# Patient Record
Sex: Male | Born: 1945 | Race: White | Hispanic: No | Marital: Married | Smoking: Never smoker
Health system: Southern US, Community
[De-identification: ages and names within clinical notes are randomized; demographics above are authoritative.]

## PROBLEM LIST (undated history)

## (undated) DIAGNOSIS — F1011 Alcohol abuse, in remission: Secondary | ICD-10-CM

## (undated) DIAGNOSIS — E785 Hyperlipidemia, unspecified: Secondary | ICD-10-CM

## (undated) DIAGNOSIS — M4802 Spinal stenosis, cervical region: Secondary | ICD-10-CM

## (undated) DIAGNOSIS — I10 Essential (primary) hypertension: Secondary | ICD-10-CM

## (undated) HISTORY — DX: Alcohol abuse, in remission: F10.11

## (undated) HISTORY — DX: Essential (primary) hypertension: I10

## (undated) HISTORY — PX: SMALL INTESTINE SURGERY: SHX150

## (undated) HISTORY — DX: Spinal stenosis, cervical region: M48.02

## (undated) HISTORY — DX: Hyperlipidemia, unspecified: E78.5

---

## 1994-05-25 HISTORY — PX: HERNIA REPAIR: SHX51

## 2004-05-25 HISTORY — PX: CARDIOVASCULAR STRESS TEST: SHX262

## 2005-06-16 ENCOUNTER — Ambulatory Visit: Payer: Self-pay | Admitting: Family Medicine

## 2009-01-04 LAB — HM COLONOSCOPY: HM Colonoscopy: NORMAL

## 2012-01-05 ENCOUNTER — Encounter: Payer: Self-pay | Admitting: Internal Medicine

## 2012-01-05 ENCOUNTER — Ambulatory Visit (INDEPENDENT_AMBULATORY_CARE_PROVIDER_SITE_OTHER): Payer: Medicare Other | Admitting: Internal Medicine

## 2012-01-05 VITALS — BP 142/80 | HR 58 | Temp 98.4°F | Resp 16 | Ht 73.0 in | Wt 191.2 lb

## 2012-01-05 DIAGNOSIS — I10 Essential (primary) hypertension: Secondary | ICD-10-CM

## 2012-01-05 DIAGNOSIS — I499 Cardiac arrhythmia, unspecified: Secondary | ICD-10-CM

## 2012-01-05 DIAGNOSIS — F1011 Alcohol abuse, in remission: Secondary | ICD-10-CM

## 2012-01-05 DIAGNOSIS — E785 Hyperlipidemia, unspecified: Secondary | ICD-10-CM

## 2012-01-05 DIAGNOSIS — G8929 Other chronic pain: Secondary | ICD-10-CM

## 2012-01-05 DIAGNOSIS — M549 Dorsalgia, unspecified: Secondary | ICD-10-CM

## 2012-01-05 DIAGNOSIS — M4802 Spinal stenosis, cervical region: Secondary | ICD-10-CM

## 2012-01-05 MED ORDER — TAMSULOSIN HCL 0.4 MG PO CAPS: 0.4000 mg | ORAL_CAPSULE | Freq: Every day | ORAL | Status: DC

## 2012-01-05 MED ORDER — ATORVASTATIN CALCIUM 10 MG PO TABS: 10.0000 mg | ORAL_TABLET | Freq: Every day | ORAL | Status: AC
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
## 2012-01-05 NOTE — Assessment & Plan Note (Addendum)
Managed with multiple drug therapy. Home bps 120/70 .  No changes today. Return for assessment of renal function and fasting lipids.

## 2012-01-06 ENCOUNTER — Encounter: Payer: Self-pay | Admitting: Internal Medicine

## 2012-01-06 DIAGNOSIS — E785 Hyperlipidemia, unspecified: Secondary | ICD-10-CM | POA: Insufficient documentation

## 2012-01-06 NOTE — Assessment & Plan Note (Signed)
Managed with statin therapy with no side effects. Return for fasting labs prior to next visit.

## 2012-01-06 NOTE — Assessment & Plan Note (Signed)
Managed with a combination of NSAIDs tramadol and Vicodin. He is planning to increase his tramadol use in an effort to decrease his Vicodin use to once a day.

## 2012-01-06 NOTE — Assessment & Plan Note (Signed)
He is a recovering alcoholic and has been abstinent for years.

## 2012-01-06 NOTE — Assessment & Plan Note (Signed)
He has occasional extra beats discovered by Dr. Suzie Portela his prior PCP this was investigated with a Holter monitor by Dr. Gwen Pounds which was apparently normal. Exam today was normal.

## 2012-01-14 ENCOUNTER — Other Ambulatory Visit (INDEPENDENT_AMBULATORY_CARE_PROVIDER_SITE_OTHER): Payer: Medicare Other | Admitting: *Deleted

## 2012-01-14 DIAGNOSIS — E785 Hyperlipidemia, unspecified: Secondary | ICD-10-CM

## 2012-01-14 DIAGNOSIS — E871 Hypo-osmolality and hyponatremia: Secondary | ICD-10-CM

## 2012-01-14 DIAGNOSIS — I10 Essential (primary) hypertension: Secondary | ICD-10-CM

## 2012-01-14 LAB — COMPREHENSIVE METABOLIC PANEL
Albumin: 4.3 g/dL (ref 3.5–5.2)
Alkaline Phosphatase: 50 U/L (ref 39–117)
Calcium: 9.1 mg/dL (ref 8.4–10.5)
Chloride: 88 mEq/L — ABNORMAL LOW (ref 96–112)
Glucose, Bld: 124 mg/dL — ABNORMAL HIGH (ref 70–99)
Potassium: 4.4 mEq/L (ref 3.5–5.1)
Sodium: 125 mEq/L — ABNORMAL LOW (ref 135–145)
Total Protein: 7.4 g/dL (ref 6.0–8.3)

## 2012-01-14 LAB — LIPID PANEL
Cholesterol: 180 mg/dL (ref 0–200)
LDL Cholesterol: 95 mg/dL (ref 0–99)
Total CHOL/HDL Ratio: 4

## 2012-01-15 NOTE — Addendum Note (Signed)
Addended by: Duncan Dull on: 01/15/2012 12:40 AM   Modules accepted: Orders

## 2012-03-04 ENCOUNTER — Encounter: Payer: Self-pay | Admitting: Internal Medicine

## 2012-03-18 ENCOUNTER — Telehealth: Payer: Self-pay | Admitting: Internal Medicine

## 2012-03-18 MED ORDER — OMEPRAZOLE 40 MG PO CPDR: 40.0000 mg | DELAYED_RELEASE_CAPSULE | Freq: Every day | ORAL | Status: AC

## 2012-03-18 NOTE — Telephone Encounter (Signed)
Refill sent #90 with 3

## 2012-03-18 NOTE — Telephone Encounter (Signed)
Pt is needing refill on Prilosec. [facility] was calling and said they faxed it over but I went through all the faxes while on the phone with the pharmacist and didn't see it. I told her to fax it over again. But she said just in case they don't come through if we could send it over to them. Pt is needing the refill today.

## 2012-03-18 NOTE — Telephone Encounter (Signed)
Pt is needing refill on Omepreazole 40mg  he uses [facility].

## 2012-03-21 NOTE — Telephone Encounter (Signed)
rx called into [facility]

## 2012-04-12 ENCOUNTER — Ambulatory Visit: Payer: Self-pay | Admitting: Orthopedic Surgery

## 2012-04-13 ENCOUNTER — Other Ambulatory Visit: Payer: Self-pay | Admitting: Internal Medicine

## 2012-04-14 NOTE — Telephone Encounter (Signed)
Dr Darrick Huntsman,  Last OV 12/2011 Flomax 0.4 Refill?

## 2012-04-15 MED ORDER — TAMSULOSIN HCL 0.4 MG PO CAPS: 0.4000 mg | ORAL_CAPSULE | Freq: Every day | ORAL | Status: AC

## 2012-04-15 NOTE — Addendum Note (Signed)
Addended by: Sherlene Shams on: 04/15/2012 07:02 AM   Modules accepted: Orders

## 2012-04-15 NOTE — Telephone Encounter (Signed)
Yes refill authorized

## 2012-05-04 ENCOUNTER — Other Ambulatory Visit: Payer: Self-pay

## 2012-05-04 MED ORDER — HYDROCHLOROTHIAZIDE 12.5 MG PO CAPS: 12.5000 mg | ORAL_CAPSULE | Freq: Every day | ORAL | Status: AC

## 2012-05-04 NOTE — Telephone Encounter (Signed)
Refill request for Hydrochlorothiazide 12.5 mg # 30 3 R sent to [facility]

## 2012-06-02 ENCOUNTER — Other Ambulatory Visit: Payer: Self-pay | Admitting: Internal Medicine

## 2012-06-02 NOTE — Telephone Encounter (Signed)
amLODipine (NORVASC) 5 MG tablet ° °# 90 °

## 2012-06-03 ENCOUNTER — Other Ambulatory Visit: Payer: Self-pay | Admitting: Internal Medicine

## 2012-06-03 MED ORDER — AMLODIPINE BESYLATE 5 MG PO TABS: 5.0000 mg | ORAL_TABLET | Freq: Every day | ORAL | Status: DC

## 2012-06-03 NOTE — Telephone Encounter (Signed)
thos patient was e mailed in august about having a low sodium and never returned for the repeat BMET. He read the message but did not return,. Ok to refill amlodipine but Do not refill his HCTZ  And please call and remind him that a low sodium can cause seizures and needs to be resolved so he needs the labs repeated

## 2012-06-03 NOTE — Telephone Encounter (Signed)
Ok to fill? Pt last seen on 8/13 and this is listed as a historical med.

## 2012-06-03 NOTE — Telephone Encounter (Signed)
An encounter is already started on this refill.

## 2012-06-03 NOTE — Telephone Encounter (Signed)
[facility] [address] fax # [phone]  amLODipine (NORVASC) 5 MG tablet  #90

## 2012-06-06 ENCOUNTER — Telehealth: Payer: Self-pay | Admitting: *Deleted

## 2012-06-06 ENCOUNTER — Other Ambulatory Visit (INDEPENDENT_AMBULATORY_CARE_PROVIDER_SITE_OTHER): Payer: Medicare Other

## 2012-06-06 DIAGNOSIS — Z13220 Encounter for screening for lipoid disorders: Secondary | ICD-10-CM

## 2012-06-06 DIAGNOSIS — Z136 Encounter for screening for cardiovascular disorders: Secondary | ICD-10-CM

## 2012-06-06 DIAGNOSIS — Z139 Encounter for screening, unspecified: Secondary | ICD-10-CM

## 2012-06-06 LAB — COMPREHENSIVE METABOLIC PANEL
ALT: 29 U/L (ref 0–53)
Albumin: 4.3 g/dL (ref 3.5–5.2)
CO2: 28 mEq/L (ref 19–32)
Calcium: 9.4 mg/dL (ref 8.4–10.5)
Chloride: 95 mEq/L — ABNORMAL LOW (ref 96–112)
Creatinine, Ser: 0.7 mg/dL (ref 0.4–1.5)
GFR: 112.36 mL/min (ref >60.00)
Total Protein: 7.4 g/dL (ref 6.0–8.3)

## 2012-06-06 LAB — LIPID PANEL: HDL: 42.3 mg/dL (ref >39.00)

## 2012-06-06 NOTE — Telephone Encounter (Signed)
Pt notified has an appt today to get labs redrawn.

## 2012-06-06 NOTE — Telephone Encounter (Signed)
Pt states that his cardiologist Dr.Kowalski told him to continue his medication (hydrochlorthiazide). Would like to know what he should do?

## 2012-06-06 NOTE — Telephone Encounter (Signed)
Two issues.  He needs a CMET and fasting lipids.  2) he never returned for a repeat BMEt last August after I stopped his HCTZ which was dropping his sodium level. So i do not advise resuming the HCTZ

## 2012-06-06 NOTE — Telephone Encounter (Signed)
What labs and dx would you like for this pt? Is it just the BMET? Thank you

## 2012-06-07 ENCOUNTER — Telehealth: Payer: Self-pay | Admitting: Internal Medicine

## 2012-06-07 NOTE — Telephone Encounter (Signed)
Did you see this? 

## 2012-06-07 NOTE — Telephone Encounter (Signed)
Message copied by Sherlene Shams on Tue Jun 07, 2012  1:29 PM ------      Message from: Jackson Latino      Created: Tue Jun 07, 2012 11:54 AM       Pt advised he had eaten before labs had been completed.

## 2012-07-09 ENCOUNTER — Other Ambulatory Visit: Payer: Self-pay

## 2012-09-12 ENCOUNTER — Encounter: Payer: Self-pay | Admitting: *Deleted

## 2012-09-12 ENCOUNTER — Other Ambulatory Visit: Payer: Self-pay | Admitting: *Deleted

## 2012-09-12 MED ORDER — LOSARTAN POTASSIUM 50 MG PO TABS: 50.0000 mg | ORAL_TABLET | Freq: Every day | ORAL | Status: DC

## 2012-09-30 ENCOUNTER — Other Ambulatory Visit: Payer: Self-pay | Admitting: Internal Medicine

## 2012-09-30 NOTE — Telephone Encounter (Signed)
Refill escripted today.

## 2012-10-27 ENCOUNTER — Ambulatory Visit: Payer: Self-pay | Admitting: Orthopedic Surgery

## 2012-11-05 ENCOUNTER — Other Ambulatory Visit: Payer: Self-pay | Admitting: Internal Medicine

## 2012-11-16 ENCOUNTER — Other Ambulatory Visit: Payer: Self-pay | Admitting: *Deleted

## 2012-12-06 ENCOUNTER — Other Ambulatory Visit: Payer: Self-pay | Admitting: Internal Medicine

## 2012-12-28 ENCOUNTER — Other Ambulatory Visit: Payer: Self-pay

## 2013-03-30 ENCOUNTER — Other Ambulatory Visit: Payer: Self-pay

## 2013-04-27 ENCOUNTER — Ambulatory Visit: Payer: Self-pay | Admitting: Orthopedic Surgery

## 2013-05-04 ENCOUNTER — Ambulatory Visit: Payer: Self-pay | Admitting: Orthopedic Surgery

## 2013-06-23 ENCOUNTER — Inpatient Hospital Stay: Payer: Self-pay | Admitting: Orthopedic Surgery

## 2013-06-23 LAB — BASIC METABOLIC PANEL
Anion Gap: 6 — ABNORMAL LOW (ref 7–16)
BUN: 16 mg/dL (ref 7–18)
CO2: 29 mmol/L (ref 21–32)
Calcium, Total: 8.8 mg/dL (ref 8.5–10.1)
Chloride: 93 mmol/L — ABNORMAL LOW (ref 98–107)
Creatinine: 0.9 mg/dL (ref 0.60–1.30)
EGFR (Non-African Amer.): >60
GLUCOSE: 212 mg/dL — AB (ref 65–99)
Osmolality: 265 (ref 275–301)
Potassium: 3.9 mmol/L (ref 3.5–5.1)
Sodium: 128 mmol/L — ABNORMAL LOW (ref 136–145)

## 2013-06-23 LAB — CBC WITH DIFFERENTIAL/PLATELET
BASOS ABS: 0.1 10*3/uL (ref 0.0–0.1)
Basophil %: 0.4 %
EOS ABS: 0 10*3/uL (ref 0.0–0.7)
Eosinophil %: 0.2 %
HCT: 41.1 % (ref 40.0–52.0)
HGB: 13.6 g/dL (ref 13.0–18.0)
LYMPHS ABS: 0.8 10*3/uL — AB (ref 1.0–3.6)
LYMPHS PCT: 6.1 %
MCH: 29.2 pg (ref 26.0–34.0)
MCHC: 33.1 g/dL (ref 32.0–36.0)
MCV: 88 fL (ref 80–100)
Monocyte #: 1.1 x10 3/mm — ABNORMAL HIGH (ref 0.2–1.0)
Monocyte %: 7.8 %
NEUTROS ABS: 11.7 10*3/uL — AB (ref 1.4–6.5)
Neutrophil %: 85.5 %
Platelet: 232 10*3/uL (ref 150–440)
RBC: 4.65 10*6/uL (ref 4.40–5.90)
RDW: 12.5 % (ref 11.5–14.5)
WBC: 13.7 10*3/uL — AB (ref 3.8–10.6)

## 2013-06-23 LAB — SEDIMENTATION RATE: Erythrocyte Sed Rate: 15 mm/hr (ref 0–20)

## 2013-06-25 LAB — CBC WITH DIFFERENTIAL/PLATELET
BASOS ABS: 0.1 10*3/uL (ref 0.0–0.1)
Basophil %: 0.6 %
Eosinophil #: 0.3 10*3/uL (ref 0.0–0.7)
Eosinophil %: 2.6 %
HCT: 34.8 % — AB (ref 40.0–52.0)
HGB: 12.3 g/dL — ABNORMAL LOW (ref 13.0–18.0)
Lymphocyte #: 2.3 10*3/uL (ref 1.0–3.6)
Lymphocyte %: 22.7 %
MCH: 30.8 pg (ref 26.0–34.0)
MCHC: 35.3 g/dL (ref 32.0–36.0)
MCV: 87 fL (ref 80–100)
Monocyte #: 1.3 x10 3/mm — ABNORMAL HIGH (ref 0.2–1.0)
Monocyte %: 12.4 %
NEUTROS PCT: 61.7 %
Neutrophil #: 6.3 10*3/uL (ref 1.4–6.5)
PLATELETS: 246 10*3/uL (ref 150–440)
RBC: 3.98 10*6/uL — AB (ref 4.40–5.90)
RDW: 12.6 % (ref 11.5–14.5)
WBC: 10.2 10*3/uL (ref 3.8–10.6)

## 2013-07-24 ENCOUNTER — Ambulatory Visit: Payer: Self-pay | Admitting: Orthopedic Surgery

## 2014-02-09 ENCOUNTER — Ambulatory Visit: Payer: Self-pay | Admitting: Gastroenterology

## 2014-08-14 IMAGING — CR RIGHT ANKLE - 2 VIEW
1 series · 2 of 2 positions shown · non-contrast
Comparison: 10/27/2012.

CLINICAL DATA: Status post hardware removal

EXAM:
RIGHT ANKLE - 2 VIEW

[Series 1: ap · 0.17mm/px · 2 of 2 slices shown]
[im 1/2]
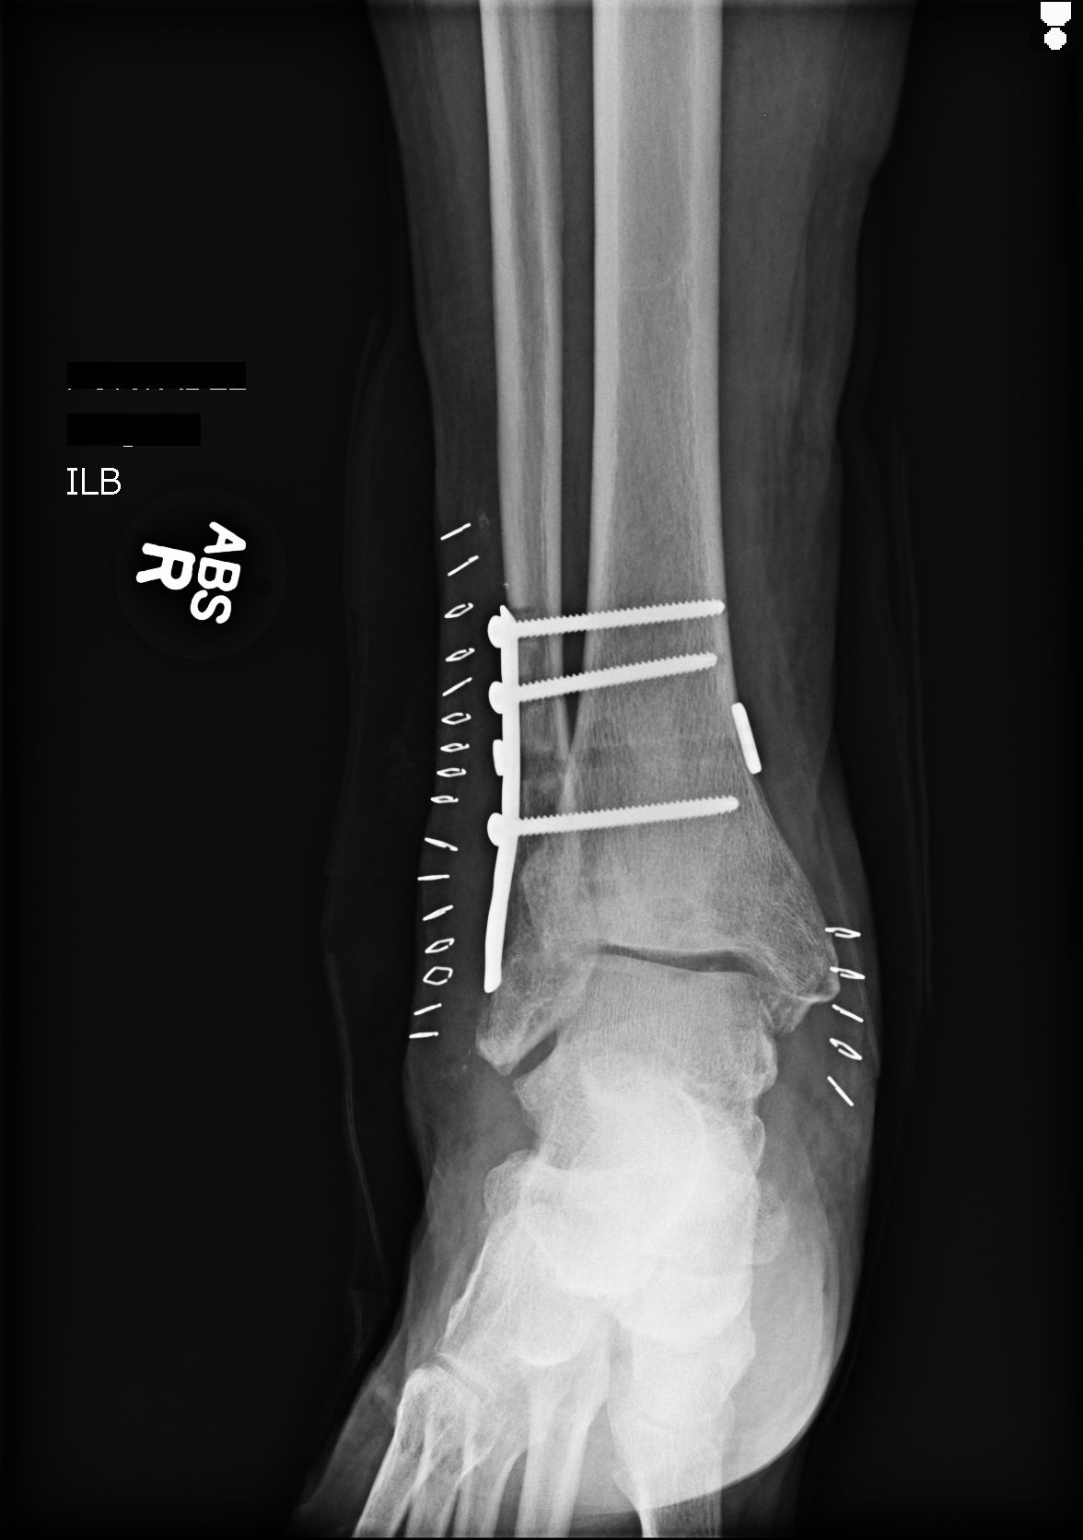
[im 2/2]
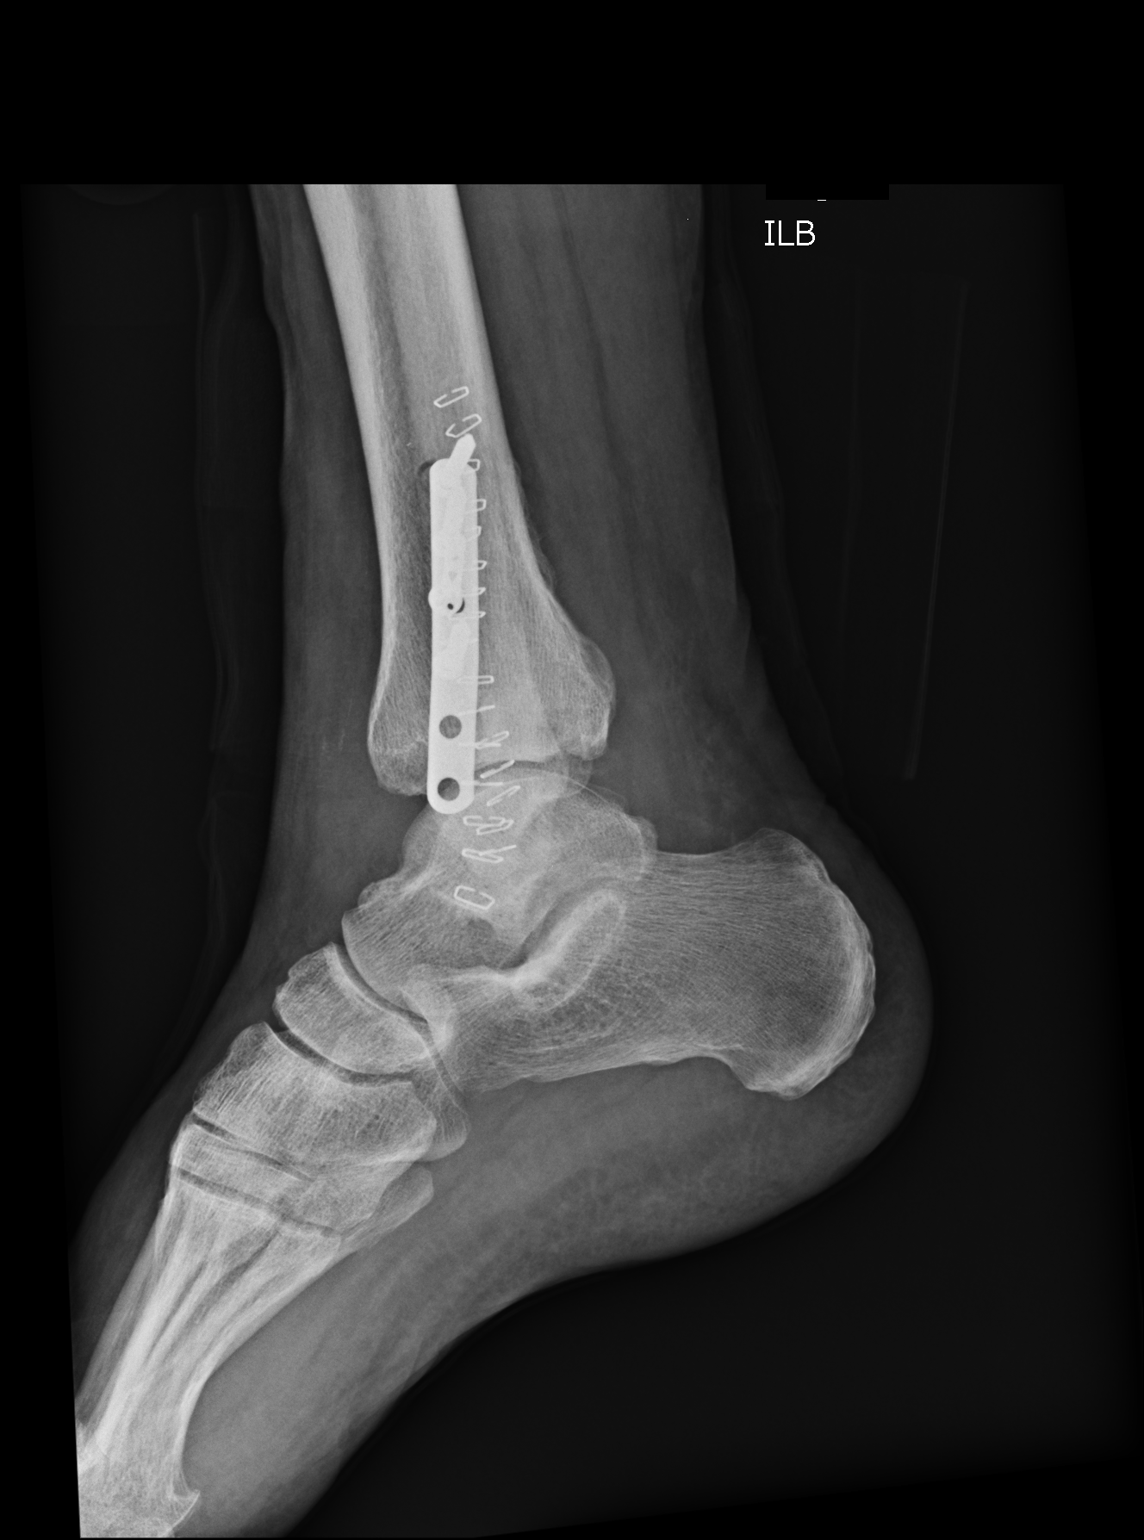

[2 of 2 positions shown; findings below may reference images not displayed]

FINDINGS: There is no acute fracture or dislocation. There is interval removal
of the previous distal fibular sideplate with interval placement of
a different metallic side plate with multiple interlocking screws
and a syndesmotic fixation device. There are postsurgical changes in
the surrounding soft tissues. The ankle mortise is intact.
IMPRESSION: Interval replacement of of the existing orthopedic hardware.

## 2014-09-14 NOTE — Op Note (Signed)
PATIENT NAME:  Taylor Mcbride, TROMPETER MR#:  [id number] DATE OF BIRTH:  [birth year]  DATE OF PROCEDURE:  05/04/2013  PREOPERATIVE DIAGNOSIS: Chronic syndesmosis rupture, right ankle.   POSTOPERATIVE DIAGNOSIS: Chronic syndesmosis rupture, right ankle.   PROCEDURE: Removal of prior hardware, right lateral ankle, and repair of syndesmosis.   ANESTHESIA: General.   SURGEON: Laurene Footman, M.D.   DESCRIPTION OF PROCEDURE: The patient was brought to the operating room and after adequate anesthesia was obtained, the right leg was prepped and draped in the usual sterile fashion with a tourniquet applied to the upper calf. After patient identification and timeout procedures were completed, the prior lateral incision was opened with the tourniquet raised. The prior plate was exposed, and the anterior to posterior screw was removed without difficulty. The lateral screw holes, however, had hex screws which could not be removed with the broken screw set from [employer], and the screws were removed by burring off the heads with a diamond-tipped bur, removing the plate and then using the [employer]ynthe[employer] broken screw set to remove the residual screw. This also gave easy removal of the prior broken screw in the distal fibula into the tibia. At this point, with all prior hardware removed and pulse lavage of the wound to remove any metal fragments as much as possible, reduction of the syndesmosis was attempted. Prior to this, the syndesmosis was opened anteriorly and scar tissue removed; however, the syndesmosis did not close down and the medial clear space appeared quite open, so an anteromedial ankle arthrotomy was carried out and debris removed with the use of a small rongeur from the medial clear space. Following this, with a 6-hole third tubular plate from [employer] applied to the lateral fibula, overdrilling was carried out and a long cortical screw was inserted, grabbing the medial cortex. On tightening this, the mortise improved.  Additional screws were placed above and below this so after placing 4 screws, there was good reduction. The initial screw, which was too long, was then removed and a TightRope syndesmotic repair implant was placed to aid in repair and to try to prevent subsequent damage or loss of stability to the syndesmosis. With 3 stainless steel 3.5 mm screws transfixing the syndesmosis along with this TightRope, there appeared to be good stability on varus and valgus stress of the ankle and near anatomic alignment. Then, 1 mL of bone putty was also inserted along the anterior syndesmosis for possible fusion of the distal tib-fib to prevent recurrence of instability as it had been a major problem. The wounds were thoroughly irrigated prior to this. The wounds were closed with 3-0 Vicryl subcutaneously and skin staples. Xeroform, 4 x 4's, Webril and Ace wrap were applied, and the patient was sent to recovery in stable condition.   ESTIMATED BLOOD LOSS: 100 mL.    TOURNIQUET TIME: 74 minutes.   There were no specimens or complication.   IMPLANTS: A 6-hole third tubular plate from [employer], 3 stainless steel 3.5 mm cortical screws and 1 TightRope syndesmosis repair implant for a stainless steel implant.   ____________________________ Laurene Footman, MD mjm:gb D: 05/04/2013 23:09:30 ET T: 05/04/2013 23:33:15 ET JOB#: [id number]  cc: Laurene Footman, MD, <Dictator> Laurene Footman MD ELECTRONICALLY SIGNED 05/05/2013 8:26

## 2014-09-14 NOTE — Op Note (Signed)
PATIENT NAME:  Taylor Mcbride, Taylor Mcbride MR#:  [id number] DATE OF BIRTH:  [birth year]  DATE OF PROCEDURE:  10/27/2012   PREOPERATIVE DIAGNOSIS: Right syndesmosis rupture with prior ORIF of the lateral malleolus.   POSTOPERATIVE DIAGNOSIS: Right syndesmosis rupture with prior ORIF of the lateral malleolus.   PROCEDURE: Open reduction and internal fixation of the right syndesmosis.   ANESTHESIA: General.   SURGEON: Laurene Footman, MD  DESCRIPTION OF PROCEDURE: The patient was brought to the operating room, with a tourniquet applied to the upper thigh, but it was not required. A bump was placed on the right hip to internally rotate the leg. After time out procedure was carried out,  the ankle was examined and the medial clear space, could be closed with inversion of the ankle. The leg was then prepped and draped in the usual sterile fashion and timeout procedures repeated. A small incision was made over through the prior incision at the level of the open screw hole distally. The fibula over drilled with a 3 5 drill and then 2 5 drill used to drill across the distal tibia. This was measured and a 55 mm, 3.5 mm cortical screw was placed through the plate with the ankle in dorsiflexion. The syndesmosis was tightened down and there restoration of the medial clear space, to normal with stability to the ankle. The wound was irrigated and closed with skin staples. Xeroform, 4 x 4's, Webril and a stirrup splint were applied. The patient was sent to the recovery room in stable condition.   ESTIMATED BLOOD LOSS: Minimal.   COMPLICATIONS: None.   SPECIMEN: None.   IMPLANT: Stryker 3.5 mm titanium screw, 55 mm in length.     ____________________________ Laurene Footman, MD mjm:cc D: 10/27/2012 21:44:29 ET T: 10/27/2012 23:39:25 ET JOB#: [id number]  cc: Laurene Footman, MD, <Dictator> Laurene Footman MD ELECTRONICALLY SIGNED 10/28/2012 19:09

## 2014-09-15 NOTE — Discharge Summary (Signed)
PATIENT NAME:  Taylor Mcbride, PRIME MR#:  [id number] DATE OF BIRTH:  Aug 19, 1945  DATE OF ADMISSION:  06/23/2013 DATE OF DISCHARGE:  06/26/2013  ADMITTING DIAGNOSIS: Right ankle fracture/dislocation with infection.   DISCHARGE DIAGNOSIS:  Right ankle fracture/dislocation with infection.  ATTENDING PHYSICIAN:  Hessie Knows.   No surgeries this visit.   HISTORY: Mr. Rudden is a 69 year old male with a right ankle fracture/dislocation that was initially treated in [place]. He had surgery initially in [place], although he had syndesmosis rupture which was not repaired. He had increased swelling and erythema with skin breakdown and drainage and presented to [facility] on 06/23/2013.   PHYSICAL EXAMINATION: GENERAL: Well developed, well nourished.  HEENT: PERRL. NECK: Supple.  RESPIRATORY: Normal respiratory effort.  CARDIOVASCULAR: Regular rate.  ABDOMEN: Denies tenderness.  SKIN: Erythema to the right ankle, open areas on lateral ankle wounds, which was previously healed.  NEUROLOGIC: Cranial nerves intact.  PSYCHIATRIC:  Alert.  MUSCULOSKELETAL: No pain with ankle range of motion. Sensation intact. Pulses intact to right lower extremity.   HOSPITAL COURSE: After initial admission on 05/27/2013, the patient was started on IV antibiotics. On 06/24/2013, antibiotics were continued and culture results from the [facility] walk-in are still pending. He was on Unasyn and clindamycin IV. On 06/25/2013, he continued his antibiotics. On 06/26/2013, he is stable and ready for discharge. No wound VAC is needed. He will be discharged home on Augmentin. Culture showed staph, not MRSA.   CONDITION AT DISCHARGE: Stable.   DISPOSITION: The patient was sent home.   DISCHARGE INSTRUCTIONS: The patient will follow up with [facility] orthopedics in 1 week for repeat skin check. He will be weight-bearing as tolerated. His dressing can be changed once daily and as needed.   DISCHARGE MEDICATIONS: Please see discharge  instructions for complete list of discharge medications.     ____________________________ Clarrisa Kaylor M. Tretha Sciara, NP amb:dmm D: 06/26/2013 09:25:18 ET T: 06/26/2013 09:35:01 ET JOB#: [id number]  cc: Elika Godar M. Tretha Sciara, NP, <Dictator> Kem Kays Riely Oetken FNP ELECTRONICALLY SIGNED 06/29/2013 15:48

## 2014-09-15 NOTE — H&P (Signed)
   Subjective/Chief Complaint right ankle pain, swelling and drainage   History of Present Illness History of ankle fracture dislocation initially treated in [place],  needing subsequent surgery to correct syndesmosis rupture.  Has has some swelling, with more erythema and swelling today with skin breakdown and drainage.   ALLERGIES:  Sulfa drugs: Unknown  HOME MEDICATIONS: Medication Instructions Status  traMADol 50 mg oral tablet 2 tab(s) orally 3 times a day, As Needed - for Pain Active  atorvastatin 10 mg oral tablet 1 tab(s) orally once a day (at bedtime) Active  Co Q-10 100 mg oral capsule 1 cap(s) orally once a day Active  multivitamin 1 tab(s) orally once a day Active  Vitamin D3 1000 intl units oral capsule 1 cap(s) orally once a day Active  Norvasc 5 mg oral tablet 1 tab(s) orally once a day (at bedtime) Active  Metoprolol Succinate ER 25 mg oral tablet, extended release 1 tab(s) orally once a day (at bedtime) Active  losartan 50 mg oral tablet 1 tab(s) orally once a day (at bedtime) Active  tamsulosin 0.4 mg oral capsule 1 cap(s) orally once a day Active  omeprazole 40 mg oral delayed release capsule 1 cap(s) orally once a day (in the morning) Active  docusate sodium sodium 100 mg oral capsule 1 cap(s) orally 3 times a day Active  baclofen 10 mg oral tablet 1 tab(s) orally 3 times a day Active  diazepam 5 mg oral tablet 0.5 tab(s) orally 4 times a day, As Needed Active  Calcium 600+D 1 tab(s) orally once a day Active  naproxen sodium sodium 220 mg oral tablet 1 tab(s) orally 3 times a day Active  Percocet 5/325 325 mg-5 mg oral tablet 1-2 tab(s) orally every 6 hours as needed for pain Active   Family and Social History:  Family History Non-Contributory   Social History negative tobacco   Place of Living Home   Review of Systems:  Subjective/Chief Complaint ankle pain   Fever/Chills No   Physical Exam:  GEN well developed, well nourished   HEENT PERRL   NECK  supple   RESP normal resp effort   CARD regular rate   ABD denies tenderness   SKIN erythema right ankle, open areas lateral ankle wound with was previously healed.   NEURO cranial nerves intact   PSYCH alert   Additional Comments no pain with ankle ROM, sensation intact, pulses intact RLE    Assessment/Admission Diagnosis post op infection, with drainage   Plan IV antibiotics, culture obtained at [facility] walk in clinic may need hardware removal if infection does not respond to antibiotics.   Electronic Signatures: Laurene Footman (MD)  (Signed 30-Jan-15 19:54)  Authored: CHIEF COMPLAINT and HISTORY, ALLERGIES, HOME MEDICATIONS, FAMILY AND SOCIAL HISTORY, REVIEW OF SYSTEMS, PHYSICAL EXAM, ASSESSMENT AND PLAN   Last Updated: 30-Jan-15 19:54 by Laurene Footman (MD)

## 2014-11-03 IMAGING — CT NM [ID] WBC WHOLE BODY 72HR
1 of 2 series · 14 of 32 positions shown, 20 images · non-contrast
Comparison: Plain film radiographs from 05/04/2013

RADIOPHARMACEUTICALS:  602.6 uCimCi 0n-333 labeled autologous
leukocytes.

CLINICAL DATA: Indium 111 pack white blood cell scan.

EXAM:
NUCLEAR MEDICINE LEUKOCYTE SCAN
TECHNIQUE: Following intravenous administration of radiolabeled white blood
cells, images of the SPECT-CT imaging of the lower extremities was
performed.

[Series 6: 3d octreo 2.5 b31s · axial · 0.98mm/px · z∈[+313,+640]mm · 14 of 314 slices shown, 20 images]
[im 21/314  soft-tissue]
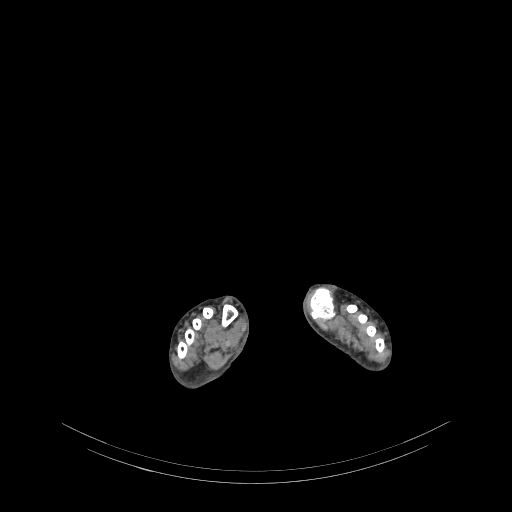
[im 21/314  bone]
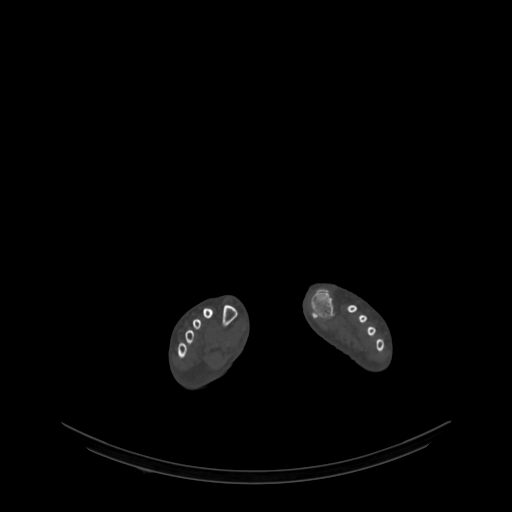
[im 42/314  soft-tissue]
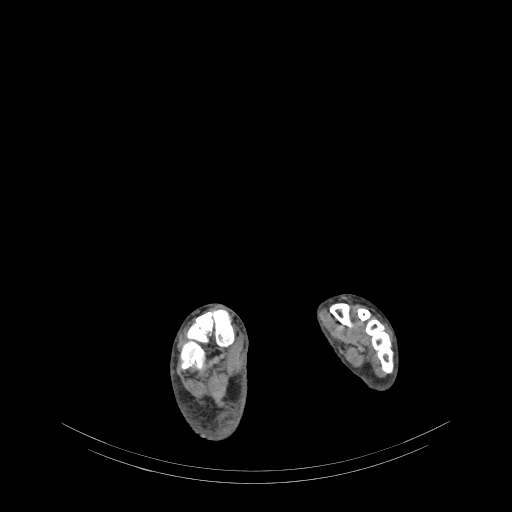
[im 63/314  soft-tissue]
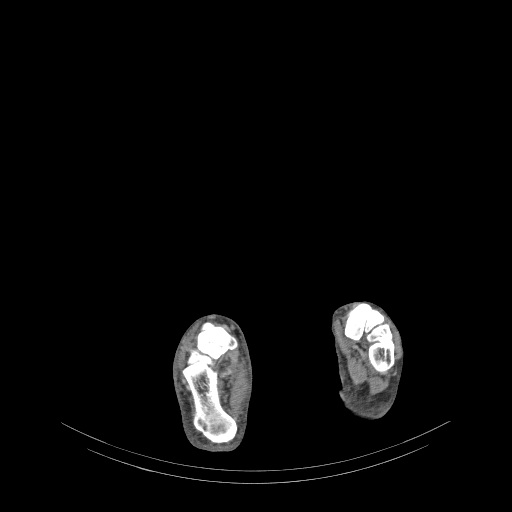
[im 84/314  soft-tissue]
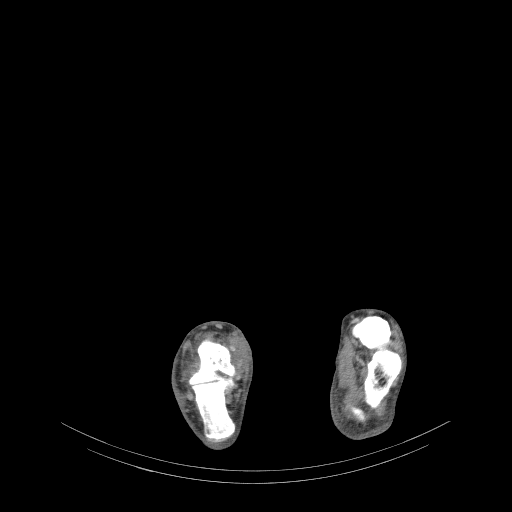
[im 105/314  soft-tissue]
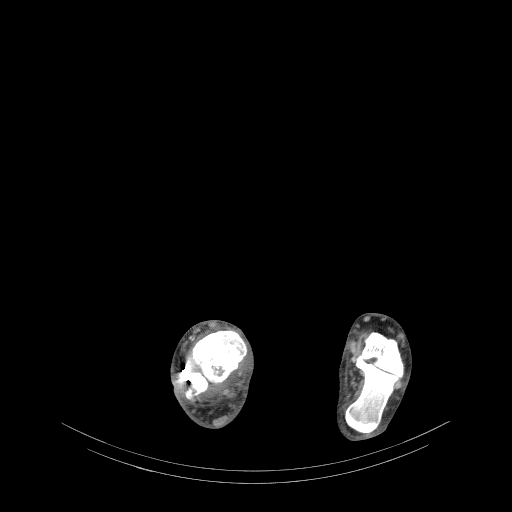
[im 126/314  soft-tissue]
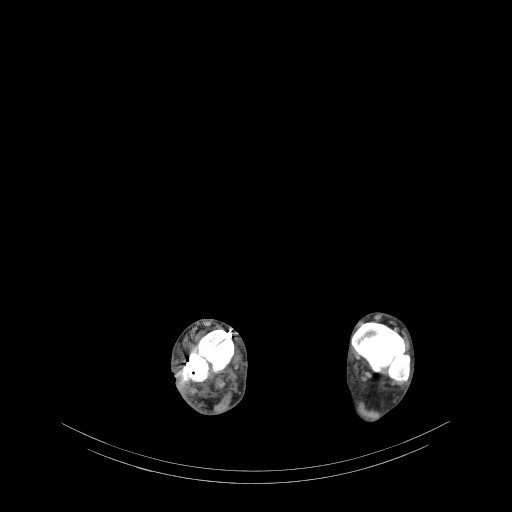
[im 147/314  soft-tissue]
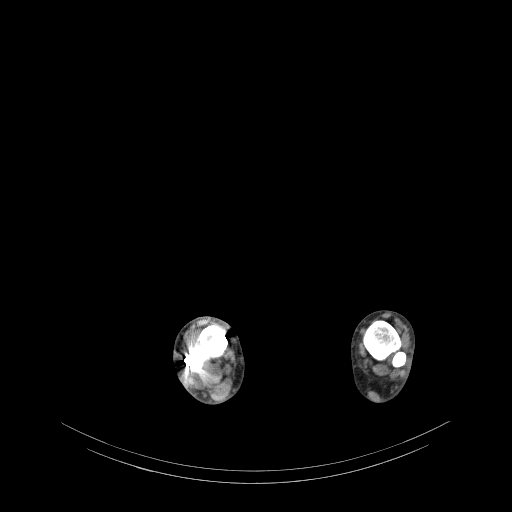
[im 167/314  soft-tissue]
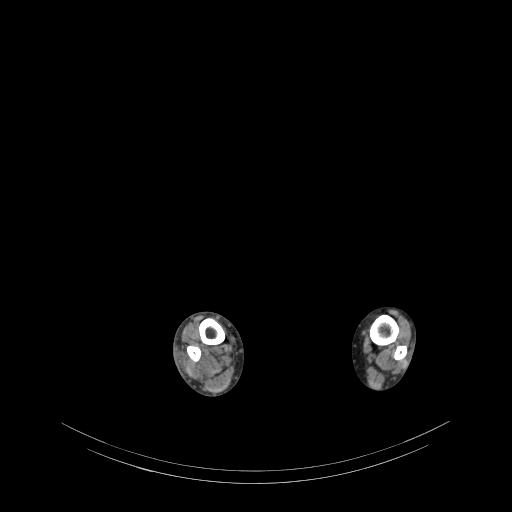
[im 188/314  soft-tissue]
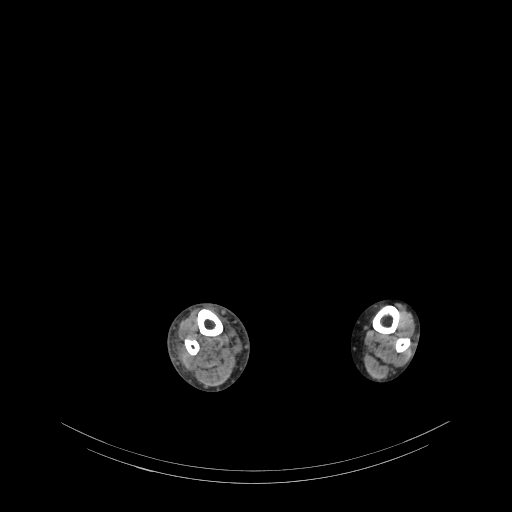
[im 188/314  bone]
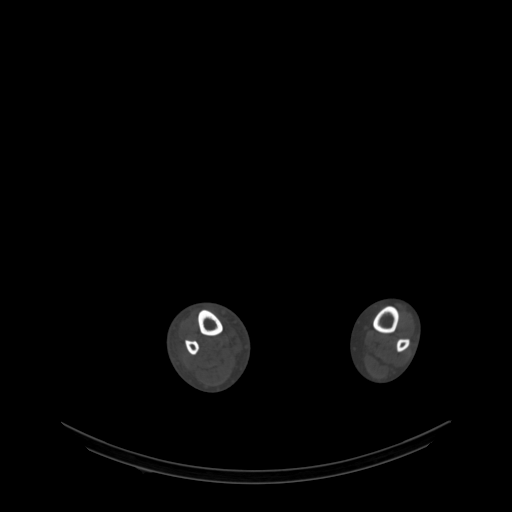
[im 209/314  soft-tissue]
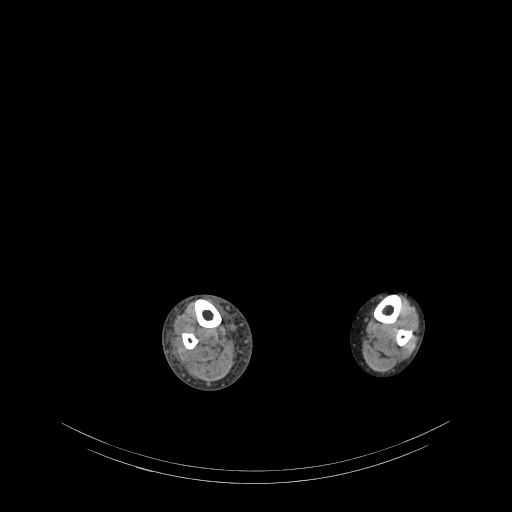
[im 230/314  soft-tissue]
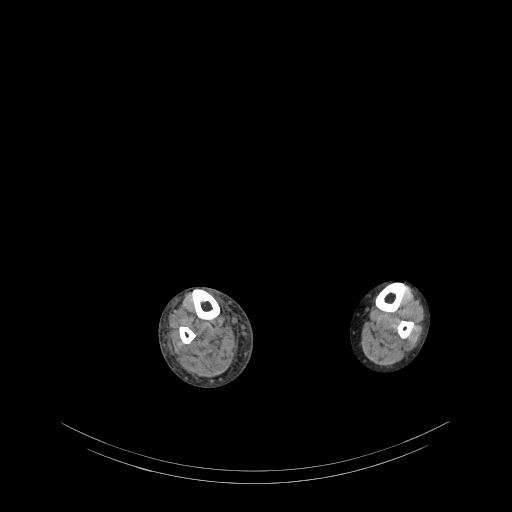
[im 230/314  lung]
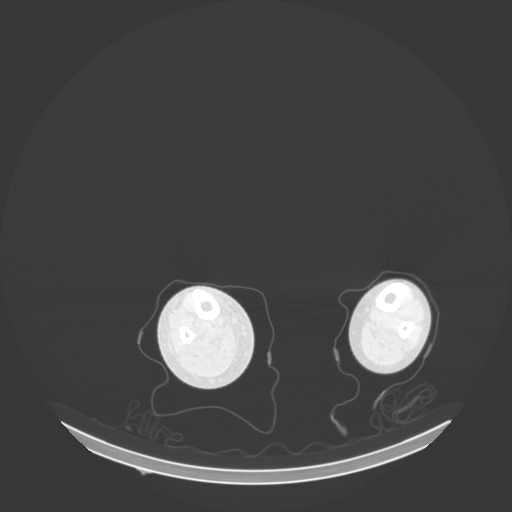
[im 251/314  soft-tissue]
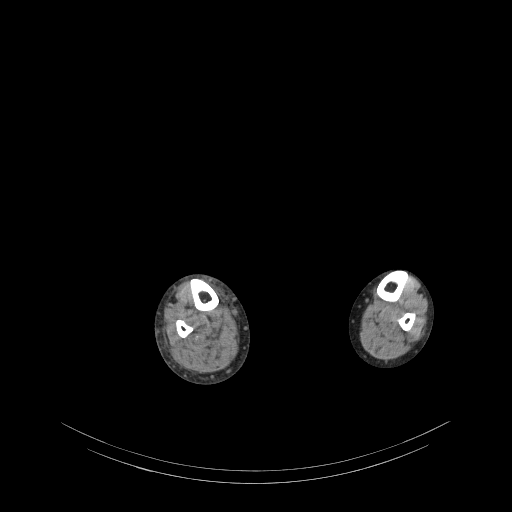
[im 251/314  lung]
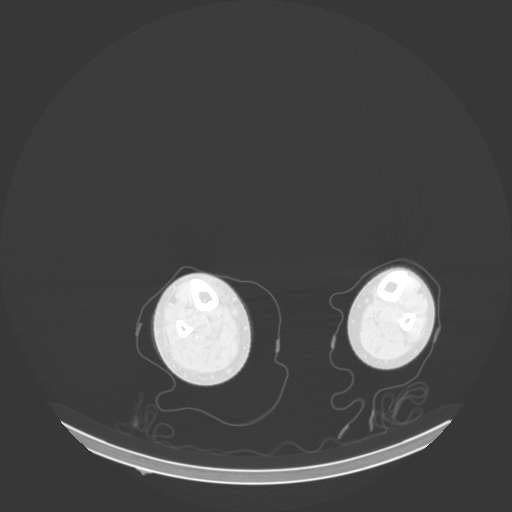
[im 272/314  soft-tissue]
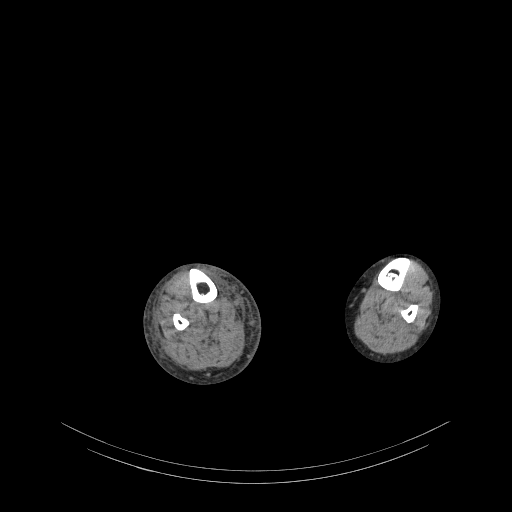
[im 272/314  lung]
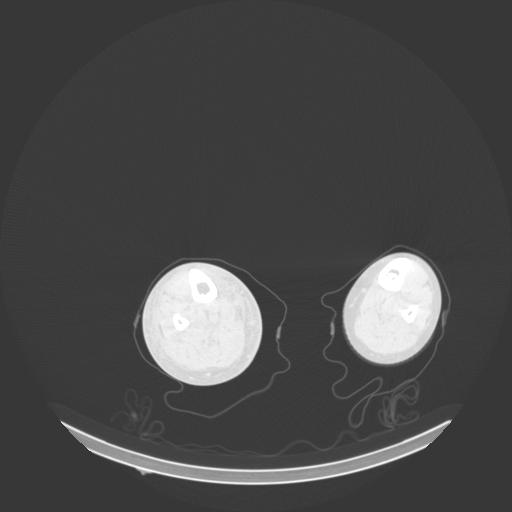
[im 293/314  soft-tissue]
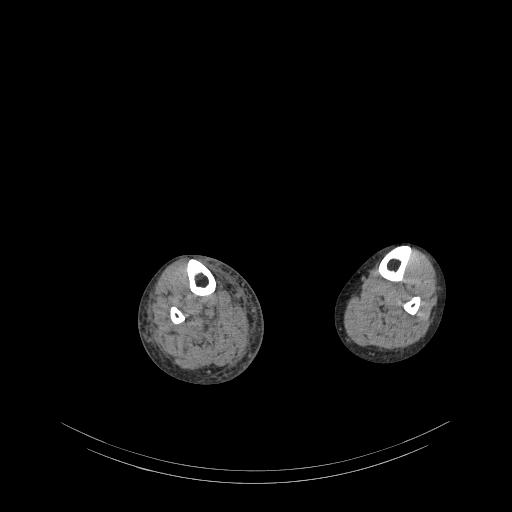
[im 293/314  lung]
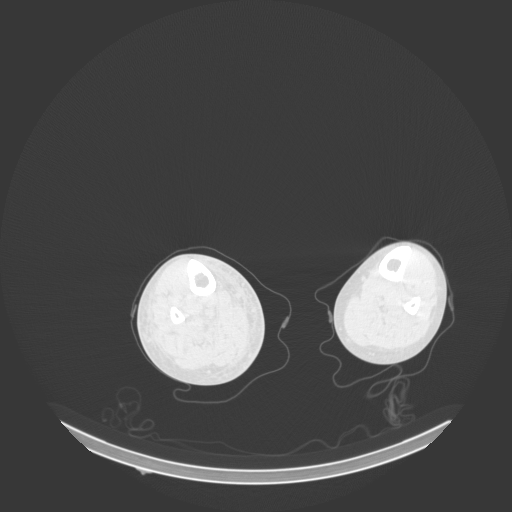

[14 of 32 positions shown; findings below may reference images not displayed]

FINDINGS: There is abnormal increased radiotracer localizing to the distal
fibula and distal tibia in the region of prior hardware fixation.
There is second, nonspecific focus of increased radiotracer uptake
localizing to the proximal shaft of the fibula.
IMPRESSION: Examination is positive for abnormal radiotracer localization to the
distal fibula and tibia in the area of previous hardware fixation.

A second, indeterminate focus of increased uptake localizes to the
proximal shaft of the fibula.

## 2019-05-04 ENCOUNTER — Other Ambulatory Visit: Payer: Self-pay

## 2019-05-04 ENCOUNTER — Other Ambulatory Visit
Admission: RE | Admit: 2019-05-04 | Discharge: 2019-05-04 | Disposition: A | Discharge status: Home / Self Care | Payer: Medicare Other | Source: Ambulatory Visit | Attending: Internal Medicine | Admitting: Internal Medicine

## 2019-05-04 DIAGNOSIS — Z01812 Encounter for preprocedural laboratory examination: Secondary | ICD-10-CM | POA: Insufficient documentation

## 2019-05-04 DIAGNOSIS — Z20828 Contact with and (suspected) exposure to other viral communicable diseases: Secondary | ICD-10-CM | POA: Diagnosis not present

## 2019-05-04 LAB — SARS CORONAVIRUS 2 (TAT 6-24 HRS): SARS Coronavirus 2: NEGATIVE

## 2019-05-08 ENCOUNTER — Ambulatory Visit: Payer: Medicare Other | Admitting: Anesthesiology

## 2019-05-08 ENCOUNTER — Ambulatory Visit
Admission: RE | Admit: 2019-05-08 | Discharge: 2019-05-08 | Disposition: A | Discharge status: Home / Self Care | Payer: Medicare Other | Attending: Internal Medicine | Admitting: Internal Medicine

## 2019-05-08 ENCOUNTER — Encounter
Admission: RE | Disposition: A | Discharge status: Home / Self Care | Payer: Self-pay | Source: Home / Self Care | Attending: Internal Medicine

## 2019-05-08 ENCOUNTER — Encounter: Payer: Self-pay | Admitting: Internal Medicine

## 2019-05-08 ENCOUNTER — Other Ambulatory Visit: Payer: Self-pay

## 2019-05-08 DIAGNOSIS — K219 Gastro-esophageal reflux disease without esophagitis: Secondary | ICD-10-CM | POA: Diagnosis not present

## 2019-05-08 DIAGNOSIS — D125 Benign neoplasm of sigmoid colon: Secondary | ICD-10-CM | POA: Diagnosis not present

## 2019-05-08 DIAGNOSIS — I10 Essential (primary) hypertension: Secondary | ICD-10-CM | POA: Diagnosis not present

## 2019-05-08 DIAGNOSIS — Z86010 Personal history of colonic polyps: Secondary | ICD-10-CM | POA: Insufficient documentation

## 2019-05-08 DIAGNOSIS — K5730 Diverticulosis of large intestine without perforation or abscess without bleeding: Secondary | ICD-10-CM | POA: Insufficient documentation

## 2019-05-08 DIAGNOSIS — E785 Hyperlipidemia, unspecified: Secondary | ICD-10-CM | POA: Insufficient documentation

## 2019-05-08 DIAGNOSIS — Z1211 Encounter for screening for malignant neoplasm of colon: Secondary | ICD-10-CM | POA: Diagnosis present

## 2019-05-08 DIAGNOSIS — Z79899 Other long term (current) drug therapy: Secondary | ICD-10-CM | POA: Insufficient documentation

## 2019-05-08 DIAGNOSIS — K640 First degree hemorrhoids: Secondary | ICD-10-CM | POA: Insufficient documentation

## 2019-05-08 HISTORY — PX: COLONOSCOPY WITH PROPOFOL: SHX5780

## 2019-05-08 SURGERY — COLONOSCOPY WITH PROPOFOL
Anesthesia: General

## 2019-05-08 MED ORDER — PROPOFOL 500 MG/50ML IV EMUL
INTRAVENOUS | Status: DC | PRN
  Administered 2019-05-08: 140 ug/kg/min via INTRAVENOUS

## 2019-05-08 MED ORDER — SODIUM CHLORIDE 0.9 % IV SOLN
INTRAVENOUS | Status: DC
  Administered 2019-05-08: 15:00:00 via INTRAVENOUS

## 2019-05-08 MED ORDER — PROPOFOL 10 MG/ML IV BOLUS
INTRAVENOUS | Status: DC | PRN
  Administered 2019-05-08: 70 mg via INTRAVENOUS
  Administered 2019-05-08: 20 mg via INTRAVENOUS
  Administered 2019-05-08: 30 mg via INTRAVENOUS

## 2019-05-08 MED ORDER — PROPOFOL 10 MG/ML IV BOLUS
INTRAVENOUS | Status: AC
  Filled 2019-05-08: qty 20

## 2019-05-08 MED ORDER — LIDOCAINE HCL (CARDIAC) PF 100 MG/5ML IV SOSY
PREFILLED_SYRINGE | INTRAVENOUS | Status: DC | PRN
  Administered 2019-05-08: 60 mg via INTRAVENOUS

## 2019-05-08 NOTE — Anesthesia Preprocedure Evaluation (Signed)
Anesthesia Evaluation  Patient identified by MRN, date of birth, ID band Patient awake    Reviewed: Allergy & Precautions, H&P , NPO status , Patient's Chart, lab work & pertinent test results, reviewed documented beta blocker date and time   History of Anesthesia Complications Negative for: history of anesthetic complications  Airway Mallampati: II  TM Distance: >3 FB Neck ROM: full    Dental  (+) Dental Advidsory Given, Teeth Intact, Caps, Missing   Pulmonary neg pulmonary ROS,    Pulmonary exam normal        Cardiovascular hypertension, (-) angina(-) Past MI and (-) Cardiac Stents negative cardio ROS Normal cardiovascular exam(-) dysrhythmias (-) Valvular Problems/Murmurs     Neuro/Psych PSYCHIATRIC DISORDERS (mild congnitive impairment) negative neurological ROS     GI/Hepatic Neg liver ROS, GERD  ,  Endo/Other  negative endocrine ROS  Renal/GU negative Renal ROS  negative genitourinary   Musculoskeletal   Abdominal   Peds  Hematology negative hematology ROS (+)   Anesthesia Other Findings Past Medical History: No date: Alcohol abuse, in remission No date: Hyperlipidemia No date: Hypertension No date: Spinal stenosis in cervical region   Reproductive/Obstetrics negative OB ROS                             Anesthesia Physical Anesthesia Plan  ASA: II  Anesthesia Plan: General   Post-op Pain Management:    Induction: Intravenous  PONV Risk Score and Plan: 2 and Propofol infusion and TIVA  Airway Management Planned: Natural Airway and Nasal Cannula  Additional Equipment:   Intra-op Plan:   Post-operative Plan:   Informed Consent: I have reviewed the patients History and Physical, chart, labs and discussed the procedure including the risks, benefits and alternatives for the proposed anesthesia with the patient or authorized representative who has indicated his/her  understanding and acceptance.     Dental Advisory Given  Plan Discussed with: Anesthesiologist, CRNA and Surgeon  Anesthesia Plan Comments:         Anesthesia Quick Evaluation

## 2019-05-08 NOTE — Anesthesia Post-op Follow-up Note (Signed)
Anesthesia QCDR form completed.        

## 2019-05-08 NOTE — Op Note (Signed)
[facility] Gastroenterology Patient Name: Taylor Mcbride Procedure Date: 05/08/2019 2:46 PM MRN: [id number] Account #: [id number] Date of Birth: Jan 02, 1946 Admit Type: Outpatient Age: 73 Room: [facility] ENDO ROOM 1 Gender: Male Note Status: Finalized Procedure:             Colonoscopy Indications:           Surveillance: Personal history of adenomatous polyps                         on last colonoscopy > 5 years ago Providers:             Lorie Apley K. Alice Reichert MD, MD Referring MD:          Ane Payment, MD (Referring MD) Medicines:             Propofol per Anesthesia Complications:         No immediate complications. Estimated blood loss:                         Minimal. Procedure:             Pre-Anesthesia Assessment:                        - The risks and benefits of the procedure and the                         sedation options and risks were discussed with the                         patient. All questions were answered and informed                         consent was obtained.                        - Patient identification and proposed procedure were                         verified prior to the procedure by the nurse. The                         procedure was verified in the procedure room.                        - ASA Grade Assessment: III - A patient with severe                         systemic disease.                        - After reviewing the risks and benefits, the patient                         was deemed in satisfactory condition to undergo the                         procedure.                        After obtaining informed consent, the colonoscope  was                         passed under direct vision. Throughout the procedure,                         the patient's blood pressure, pulse, and oxygen                         saturations were monitored continuously. The                         Colonoscope was introduced through the anus and                   advanced to the the cecum, identified by appendiceal                         orifice and ileocecal valve. The colonoscopy was                         performed without difficulty. The patient tolerated                         the procedure well. The quality of the bowel                         preparation was good. The ileocecal valve, appendiceal                         orifice, and rectum were photographed. Findings:      The perianal and digital rectal examinations were normal. Pertinent       negatives include normal sphincter tone and no palpable rectal lesions.      Non-bleeding internal hemorrhoids were found during retroflexion. The       hemorrhoids were Grade I (internal hemorrhoids that do not prolapse).      Multiple small and large-mouthed diverticula were found in the sigmoid       colon. There was no evidence of diverticular bleeding.      A 7 mm polyp was found in the sigmoid colon. The polyp was sessile. The       polyp was removed with a cold snare. Resection and retrieval were       complete.      The exam was otherwise without abnormality. Impression:            - Non-bleeding internal hemorrhoids.                        - Severe diverticulosis in the sigmoid colon. There                         was no evidence of diverticular bleeding.                        - One 7 mm polyp in the sigmoid colon, removed with a                         cold snare. Resected and retrieved.                        -  The examination was otherwise normal. Recommendation:        - Patient has a contact number available for                         emergencies. The signs and symptoms of potential                         delayed complications were discussed with the patient.                         Return to normal activities tomorrow. Written                         discharge instructions were provided to the patient.                        - Resume previous diet.                         - Continue present medications.                        - Await pathology results.                        - Repeat colonoscopy is recommended for surveillance.                         The colonoscopy date will be determined after                         pathology results from today's exam become available                         for review.                        - Return to GI office PRN.                        - The findings and recommendations were discussed with                         the patient. Procedure Code(s):     --- Professional ---                        [id number], Colonoscopy, flexible; with removal of                         tumor(s), polyp(s), or other lesion(s) by snare                         technique Diagnosis Code(s):     --- Professional ---                        K57.30, Diverticulosis of large intestine without                         perforation or abscess without bleeding  K63.5, Polyp of colon                        K64.0, First degree hemorrhoids                        Z86.010, Personal history of colonic polyps CPT copyright 2019 [employer]. All rights reserved. The codes documented in this report are preliminary and upon coder review may  be revised to meet current compliance requirements. Efrain Sella MD, MD 05/08/2019 3:19:02 PM This report has been signed electronically. Number of Addenda: 0 Note Initiated On: 05/08/2019 2:46 PM Scope Withdrawal Time: 0 hours 6 minutes 21 seconds  Total Procedure Duration: 0 hours 18 minutes 9 seconds  Estimated Blood Loss:  Estimated blood loss was minimal.      [facility]

## 2019-05-08 NOTE — H&P (Signed)
Outpatient short stay form Pre-procedure 05/08/2019 2:15 PM Teodoro K. Alice Reichert, M.D.  Primary Physician: Barbaraann Boys, M.D.  Reason for visit:  Personal hx of tubular adenomas x 2 (2010)  History of present illness:                            Patient presents for colonoscopy for a personal hx of colon polyps. The patient denies abdominal pain, abnormal weight loss or rectal bleeding.    No current facility-administered medications for this encounter.  Medications Prior to Admission  Medication Sig Dispense Refill Last Dose  . amLODipine (NORVASC) 5 MG tablet TAKE 1 TABLET BY MOUTH EVERY DAY 30 tablet 0 05/07/2019 at Unknown time  . atorvastatin (LIPITOR) 10 MG tablet Take 1 tablet (10 mg total) by mouth daily. 30 tablet 3 05/07/2019 at Unknown time  . baclofen (LIORESAL) 10 MG tablet Take 10 mg by mouth 3 (three) times daily.     Marland Kitchen co-enzyme Q-10 30 MG capsule Take 30 mg by mouth 3 (three) times daily.     . diazepam (VALIUM) 5 MG tablet Take 5 mg by mouth as needed.     . docusate sodium (COLACE) 100 MG capsule Take 100 mg by mouth 3 (three) times daily as needed.     . hydrochlorothiazide (MICROZIDE) 12.5 MG capsule Take 1 capsule (12.5 mg total) by mouth daily. (Patient not taking: Reported on 05/08/2019) 30 capsule 3 Not Taking at Unknown time  . HYDROcodone-acetaminophen (NORCO) 10-325 MG per tablet Take 1 tablet by mouth daily as needed.     Marland Kitchen losartan (COZAAR) 50 MG tablet TAKE 1 TABLET BY MOUTH EVERY DAY 90 tablet 0   . metoprolol succinate (TOPROL-XL) 100 MG 24 hr tablet Take 100 mg by mouth daily. Take with or immediately following a meal.     . Multiple Vitamin (MULTIVITAMIN) tablet Take 1 tablet by mouth daily.     . naproxen (NAPROSYN) 250 MG tablet Take 250 mg by mouth 2 (two) times daily with a meal.     . niacin 250 MG CR capsule Take 250 mg by mouth at bedtime.     Marland Kitchen omeprazole (PRILOSEC) 40 MG capsule Take 1 capsule (40 mg total) by mouth daily. 90 capsule 3   .  Tamsulosin HCl (FLOMAX) 0.4 MG CAPS TAKE 1 CAPSULE BY MOUTH DAILY 30 capsule 3   . Tamsulosin HCl (FLOMAX) 0.4 MG CAPS Take 1 capsule (0.4 mg total) by mouth daily. 30 capsule 11   . traMADol (ULTRAM) 50 MG tablet Take 50 mg by mouth 3 (three) times daily as needed.        Allergies  Allergen Reactions  . Nsaids   . Sulfa Antibiotics      Past Medical History:  Diagnosis Date  . Alcohol abuse, in remission   . Hyperlipidemia   . Hypertension   . Spinal stenosis in cervical region     Review of systems:  Otherwise negative.    Physical Exam  Gen: Alert, oriented. Appears stated age.  HEENT: [place]/AT. PERRLA. Lungs: CTA, no wheezes. CV: RR nl S1, S2. Abd: soft, benign, no masses. BS+ Ext: No edema. Pulses 2+    Planned procedures: Proceed with colonoscopy. The patient understands the nature of the planned procedure, indications, risks, alternatives and potential complications including but not limited to bleeding, infection, perforation, damage to internal organs and possible oversedation/side effects from anesthesia. The patient agrees and gives consent to proceed.  Please refer to procedure notes for findings, recommendations and patient disposition/instructions.     Teodoro K. Alice Reichert, M.D. Gastroenterology 05/08/2019  2:15 PM

## 2019-05-08 NOTE — Transfer of Care (Signed)
Immediate Anesthesia Transfer of Care Note  Patient: Taylor Mcbride  Procedure(s) Performed: COLONOSCOPY WITH PROPOFOL (N/A )  Patient Location: PACU  Anesthesia Type:General  Level of Consciousness: sedated  Airway & Oxygen Therapy: Patient Spontanous Breathing  Post-op Assessment: Report given to RN and Post -op Vital signs reviewed and stable  Post vital signs: Reviewed and stable  Last Vitals:  Vitals Value Taken Time  BP 117/73 05/08/19 1523  Temp 36.2 C 05/08/19 1523  Pulse 63 05/08/19 1523  Resp 16 05/08/19 1523  SpO2 98 % 05/08/19 1523    Last Pain:  Vitals:   05/08/19 1523  TempSrc: Temporal  PainSc: Asleep         Complications: No apparent anesthesia complications

## 2019-05-08 NOTE — Interval H&P Note (Signed)
History and Physical Interval Note:  05/08/2019 2:16 PM  Taylor Mcbride  has presented today for surgery, with the diagnosis of PERSONAL HX.OF COLON POLYPS.  The various methods of treatment have been discussed with the patient and family. After consideration of risks, benefits and other options for treatment, the patient has consented to  Procedure(s): COLONOSCOPY WITH PROPOFOL (N/A) as a surgical intervention.  The patient's history has been reviewed, patient examined, no change in status, stable for surgery.  I have reviewed the patient's chart and labs.  Questions were answered to the patient's satisfaction.     [place], [place]

## 2019-05-09 ENCOUNTER — Encounter: Payer: Self-pay | Admitting: *Deleted

## 2019-05-09 NOTE — Anesthesia Postprocedure Evaluation (Signed)
Anesthesia Post Note  Patient: Gaspard Wilder  Procedure(s) Performed: COLONOSCOPY WITH PROPOFOL (N/A )  Patient location during evaluation: Endoscopy Anesthesia Type: General Level of consciousness: awake and alert Pain management: pain level controlled Vital Signs Assessment: post-procedure vital signs reviewed and stable Respiratory status: spontaneous breathing, nonlabored ventilation, respiratory function stable and patient connected to nasal cannula oxygen Cardiovascular status: blood pressure returned to baseline and stable Postop Assessment: no apparent nausea or vomiting Anesthetic complications: no     Last Vitals:  Vitals:   05/08/19 1533 05/08/19 1543  BP: 126/77 (!) 143/80  Pulse: 62 (!) 58  Resp: 16 16  Temp:    SpO2: 96% 99%    Last Pain:  Vitals:   05/08/19 1543  TempSrc:   PainSc: 0-No pain                 Martha Clan

## 2019-05-10 LAB — SURGICAL PATHOLOGY

## 2019-06-18 ENCOUNTER — Ambulatory Visit: Payer: Self-pay

## 2019-06-18 ENCOUNTER — Ambulatory Visit: Payer: Medicare PPO | Attending: Internal Medicine

## 2019-06-18 DIAGNOSIS — Z23 Encounter for immunization: Secondary | ICD-10-CM | POA: Insufficient documentation

## 2019-06-18 NOTE — Progress Notes (Signed)
   Covid-19 Vaccination Clinic  Name:  Taylor Mcbride    MRN: [id number] DOB: 1946-05-17  06/18/2019  Taylor Mcbride was observed post Covid-19 immunization for 15 minutes without incidence. He was provided with Vaccine Information Sheet and instruction to access the V-Safe system.   Taylor Mcbride was instructed to call 911 with any severe reactions post vaccine: Marland Kitchen Difficulty breathing  . Swelling of your face and throat  . A fast heartbeat  . A bad rash all over your body  . Dizziness and weakness    Immunizations Administered    Name Date Dose VIS Date Route   Pfizer COVID-19 Vaccine 06/18/2019 11:01 AM 0.3 mL 05/05/2019 Intramuscular   Manufacturer: [place]   Lot: [id number]   [place]: [id number]

## 2019-06-20 ENCOUNTER — Ambulatory Visit: Payer: Medicare Other

## 2019-06-29 ENCOUNTER — Ambulatory Visit: Payer: Medicare Other

## 2019-07-10 ENCOUNTER — Ambulatory Visit: Payer: Medicare PPO | Attending: Internal Medicine

## 2019-07-10 DIAGNOSIS — Z23 Encounter for immunization: Secondary | ICD-10-CM | POA: Insufficient documentation

## 2019-07-10 NOTE — Progress Notes (Signed)
   Covid-19 Vaccination Clinic  Name:  Taylor Mcbride    MRN: [id number] DOB: November 05, 1945  07/10/2019  [name] was observed post Covid-19 immunization for 15 minutes without incidence. He was provided with Vaccine Information Sheet and instruction to access the V-Safe system.   [name] was instructed to call 911 with any severe reactions post vaccine: Marland Kitchen Difficulty breathing  . Swelling of your face and throat  . A fast heartbeat  . A bad rash all over your body  . Dizziness and weakness    Immunizations Administered    Name Date Dose VIS Date Route   Pfizer COVID-19 Vaccine 07/10/2019  8:21 AM 0.3 mL 05/05/2019 Intramuscular   Manufacturer: [place]   Lot: [id number]   [place]: [id number]

## 2019-07-18 ENCOUNTER — Ambulatory Visit: Payer: Medicare Other

## 2024-02-21 ENCOUNTER — Encounter: Payer: Self-pay | Admitting: Physical Therapy

## 2024-02-21 ENCOUNTER — Ambulatory Visit: Attending: Physician Assistant | Admitting: Physical Therapy

## 2024-02-21 DIAGNOSIS — M2170 Unequal limb length (acquired), unspecified site: Secondary | ICD-10-CM | POA: Insufficient documentation

## 2024-02-21 DIAGNOSIS — M6281 Muscle weakness (generalized): Secondary | ICD-10-CM | POA: Insufficient documentation

## 2024-02-21 DIAGNOSIS — G8929 Other chronic pain: Secondary | ICD-10-CM | POA: Diagnosis present

## 2024-02-21 DIAGNOSIS — R2689 Other abnormalities of gait and mobility: Secondary | ICD-10-CM | POA: Insufficient documentation

## 2024-02-21 DIAGNOSIS — M1710 Unilateral primary osteoarthritis, unspecified knee: Secondary | ICD-10-CM | POA: Insufficient documentation

## 2024-02-21 DIAGNOSIS — M25561 Pain in right knee: Secondary | ICD-10-CM | POA: Diagnosis present

## 2024-02-21 NOTE — Therapy (Signed)
 OUTPATIENT PHYSICAL THERAPY LOWER EXTREMITY EVALUATION  Patient Name: Taylor Mcbride MRN: [id number] DOB:06-09-1945, 78 y.o., male Today's Date: 02/21/2024  END OF SESSION:  PT End of Session - 02/21/24 1602     Visit Number 1    Number of Visits 12    Date for Recertification  04/03/24    PT Start Time 1430    PT Stop Time 1522    PT Time Calculation (min) 52 min    Activity Tolerance Patient tolerated treatment well;No increased pain;Patient limited by fatigue    Behavior During Therapy [facility] for tasks assessed/performed          Past Medical History:  Diagnosis Date   Alcohol abuse, in remission    Hyperlipidemia    Hypertension    Spinal stenosis in cervical region    Past Surgical History:  Procedure Laterality Date   CARDIOVASCULAR STRESS TEST  2006   normal, during admission for chest pain,  [place]    COLONOSCOPY WITH PROPOFOL  N/A 05/08/2019   Procedure: COLONOSCOPY WITH PROPOFOL ;  Surgeon: Toledo, Ladell POUR, MD;  Location: [facility] ENDOSCOPY;  Service: Gastroenterology;  Laterality: N/A;   HERNIA REPAIR  1996   right inguinal hernia repair with mesh   SMALL INTESTINE SURGERY     cerivcal disk fusion   Patient Active Problem List   Diagnosis Date Noted   Hypertension 01/05/2012   Hyperlipidemia with target low density lipoprotein (LDL) cholesterol less than 100 mg/dL 91/86/7986   Arrhythmia 01/05/2012   Chronic back pain greater than 3 months duration 01/05/2012   Spinal stenosis in cervical region    Alcohol abuse, in remission     PCP: Delfina Pao, MD  REFERRING PROVIDER: Ewing, Lamar RIGGERS  REFERRING DIAG:  M17.11 (ICD-10-CM) - Unilateral primary osteoarthritis, right knee  M25.561 (ICD-10-CM) - Pain in right knee  G89.29 (ICD-10-CM) - Other chronic pain  R26.81 (ICD-10-CM) - Unsteadiness on feet    THERAPY DIAG:  Chronic pain of right knee  Other abnormalities of gait and mobility  Muscle weakness (generalized)  Acquired leg length  discrepancy  Rationale for Evaluation and Treatment: Rehabilitation  ONSET DATE: ~August 2025  SUBJECTIVE:   SUBJECTIVE STATEMENT: Pt is a 78 year old male who presents to physical therapy with concerns of chronic right knee pain and general lower extremity strength/endurance concerns affecting his functional independence. Pt has 0/10 right knee pain at rest but shooting pain can raise to a 10/10 with standing for long periods of time at church or walking for long periods of time in the community with his wife. Pt states that he feels fatigued with these activities and has noticed occasional instances of his right kne giving out. Pt reports that this is a chronic issue that has started irritating him more within the past two months. Pain is alleviated with rest and 1 week usage of right knee brace may be helping too, I'm not sure. Pt reports an allergy to NSAIDs so he is unable to take them for pain management at this time.   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 Pt reports having 4 metal rods placed in his right ankle which is why he has been wearing a brace over his right ankle for years. Pt has not used orthotic in right sneaker but does report that his left leg is longer than my right. Pt ambulating with SPC in clinic on this day but states that this is new for him and that he has  been walking at home without a device for the most part. Pt reports being independent at home and participates in yoga twice a week for general stretching/core strengthening.  PAIN:  Are you having pain? Yes: NPRS scale: 0/10 current, 10/10 at worst Pain location: General right knee pain Pain description: shooting Aggravating factors: Prolonged standing, prolonged walking, pivoting/turning at home, walking on outdoor surfaces Relieving factors: Rest  PRECAUTIONS: None  RED FLAGS: None   WEIGHT BEARING RESTRICTIONS: No  FALLS:  Has patient fallen in  last 6 months? Yes. Number of falls 1 (outdoors due to multitasking, assisted by family to get back up)  LIVING ENVIRONMENT: Lives with: lives with their spouse Lives in: House/apartment Stairs: No Has following equipment at home: Single point cane  OCCUPATION: Retired  PLOF: Independent  PATIENT GOALS: To decrease shooting pain in right knee and improve lower extremity strength so that he may return to outdoor walking with his wife and dog with greater ease  NEXT MD VISIT: None scheduled currently  OBJECTIVE:  Note: Objective measures were completed at Evaluation unless otherwise noted.  DIAGNOSTIC FINDINGS: N/A.  See care everywhere for [facility] records.  PATIENT SURVEYS:  LEFS: 31/80  COGNITION: Overall cognitive status: Within functional limits for tasks assessed     SENSATION: Light touch: WFL  MUSCLE LENGTH: Hamstrings: Right 75 deg; Left 65 deg   POSTURE: rounded shoulders, forward head, increased thoracic kyphosis, flexed trunk , and weight shift left  PALPATION: No significant tenderness or pain to touch  LOWER EXTREMITY ROM:  Active ROM Right eval Left eval  Hip flexion 94 deg 92 deg  Hip extension    Hip abduction    Hip adduction    Hip internal rotation 18 deg 14 deg  Hip external rotation 24 deg 29 deg  Knee flexion 121 deg 119 deg  Knee extension -5 deg -1 deg  Ankle dorsiflexion    Ankle plantarflexion    Ankle inversion    Ankle eversion     (Blank rows = not tested)  LOWER EXTREMITY MMT:  MMT Right eval Left eval  Hip flexion -4 -4  Hip extension    Hip abduction 4- 4-  Hip adduction 4- 4  Hip internal rotation 4- 4-  Hip external rotation 4- 4-  Knee flexion 4- 4-  Knee extension 4- 4+  Ankle dorsiflexion NT 4+  Ankle plantarflexion    Ankle inversion    Ankle eversion     (Blank rows = not tested)  LOWER EXTREMITY SPECIAL TESTS:  FADDIR: Negative SLR: Negative   FUNCTIONAL TESTS:  5xSTS: 24.47 seconds (with bilateral  UE support from chair arms)  GAIT: Distance walked: 40 ft around clinic Assistive device utilized: Single point cane and None Level of assistance: Modified independence Comments: Pt ambulated with SPC in RUE for support and was observed to have wide BOS with SPC and lateral trunk lean to left side. Pt ambulated with bilateral shortened step length, forward head posture and rounded shoulders, and decreased cadence. Pt ambulated with a step to gait pattern and decreased hip extension noted during toe off phase. Pt with significant right knee valgus noted during gait and during static stance.    Leg Length from umbilicus: symmetrical bilaterally Right leg length from ASIS: 103 cm (decreased right knee flexion and knee valgus present) Left leg length from ASIS: 100 cm  TREATMENT DATE: 02/21/2024    See evaluation/ HEP  PATIENT EDUCATION:  Education details: Anatomy involved, prognosis, POC, HEP Person educated: Patient Education method: Explanation, Demonstration, Tactile cues, Verbal cues, and Handouts Education comprehension: verbalized understanding and returned demonstration  HOME EXERCISE PROGRAM: Access Code: [email or web address] URL: [email or web address] Date: 02/21/2024 Prepared by: Ozell Sero Exercises - Standing Hip Extension with Counter Support - 1 x daily - 7 x weekly - 2 sets - 12 reps - Standing Hip Abduction with Counter Support - 1 x daily - 7 x weekly - 2 sets - 12 reps - Standing Hamstring Stretch with Step - 1 x daily - 7 x weekly - 3 sets - 10 reps - 20 sec. hold - Supine March - 1 x daily - 7 x weekly - 2 sets - 10 reps   ASSESSMENT:  CLINICAL IMPRESSION: Patient is a 78 y.o. male who was seen today for physical therapy evaluation and treatment for chronic right knee pain. Pt is currently limited by mobility of right knee, gross LE strength,  self-perceived functional ability, abnormal gait mechanics, and abnormalities of posture which contribute to difficulty standing for a prolonged period of time, walking on uneven/outdoor surfaces, walking community distances, and completing household tasks without right knee pain. Pt would benefit from skilled physical therapy intervention 2x a week for 6 weeks in order to address limitations listed above and maximize functional outcomes.   OBJECTIVE IMPAIRMENTS: Abnormal gait, decreased activity tolerance, decreased balance, decreased endurance, decreased knowledge of use of DME, difficulty walking, decreased ROM, decreased strength, postural dysfunction, and pain.   ACTIVITY LIMITATIONS: carrying, lifting, standing, and transfers  PARTICIPATION LIMITATIONS: cleaning, laundry, and community activity  PERSONAL FACTORS: Age and 1-2 comorbidities: hypertension and chronic low back pain are also affecting patient's functional outcome.   REHAB POTENTIAL: Good  CLINICAL DECISION MAKING: Evolving/moderate complexity  EVALUATION COMPLEXITY: High   GOALS: Goals reviewed with patient? Yes  SHORT TERM GOALS: Target date: 03/13/2024 Pt to increase gross bilateral hip strength to at least a 4/5 in order to improve ability to stand for prolonged periods of time which is required for attending church services.  Baseline:see above Goal status: INITIAL  2.  Pt to experience no greater than 6/10 right knee pain at it's worst to improve ability to complete household tasks with greater ease.  Baseline: 10/10 at worst Goal status: INITIAL  LONG TERM GOALS: Target date: 04/03/2024  Pt to increase 5xSTS score to at least 15 seconds without the use of bilateral upper extremity support a to improve independence with functional transfers and decrease fall risk.  Baseline: 24.47 seconds with bilateral UE usage Goal status: INITIAL  2.  Pt to increase LEFs score to at least 49/80 to improve self-confidence  of functional abilities with daily activities.  Baseline: 31/80 Goal status: INITIAL  3.  Pt to improve gait mechanics without significant valgus of right knee and an improved upright posture with the appropriately chosen assistive device so that he experiences greater ease with ambulating [place] distances with his wife.  Baseline: see above  Goal status: INITIAL   PLAN:  PT FREQUENCY: 2x/week  PT DURATION: 6 weeks  PLANNED INTERVENTIONS: 97164- PT Re-evaluation, 97110-Therapeutic exercises, 97530- Therapeutic activity, [id number]- Neuromuscular re-education, 97535- Self Care, [id number]- Manual therapy, Patient/Family education, Balance training, Cryotherapy, and Moist heat  PLAN FOR NEXT SESSION: Assess baseline for functional/dynamic balance to determine most appropriate AD for patient. Discuss potential use of right foot orthotic. Manual hamstring stretch.   Michael C  Ileene, PT, DPT # [phone] Curtistine Bracket, Student-PT 02/21/2024, 4:06 PM

## 2024-02-24 ENCOUNTER — Ambulatory Visit: Admitting: Physical Therapy

## 2024-02-29 ENCOUNTER — Ambulatory Visit: Attending: Physician Assistant | Admitting: Physical Therapy

## 2024-02-29 DIAGNOSIS — R2689 Other abnormalities of gait and mobility: Secondary | ICD-10-CM | POA: Diagnosis present

## 2024-02-29 DIAGNOSIS — M2170 Unequal limb length (acquired), unspecified site: Secondary | ICD-10-CM | POA: Diagnosis present

## 2024-02-29 DIAGNOSIS — M25561 Pain in right knee: Secondary | ICD-10-CM | POA: Diagnosis present

## 2024-02-29 DIAGNOSIS — M6281 Muscle weakness (generalized): Secondary | ICD-10-CM | POA: Insufficient documentation

## 2024-02-29 DIAGNOSIS — M1710 Unilateral primary osteoarthritis, unspecified knee: Secondary | ICD-10-CM | POA: Diagnosis present

## 2024-02-29 DIAGNOSIS — G8929 Other chronic pain: Secondary | ICD-10-CM | POA: Insufficient documentation

## 2024-02-29 NOTE — Therapy (Unsigned)
 OUTPATIENT PHYSICAL THERAPY LOWER EXTREMITY TREATMENT  Patient Name: Taylor Mcbride MRN: [id number] DOB:[birth year], 78 y.o., male Today's Date: 02/29/2024  END OF SESSION:  PT End of Session - 02/29/24 1435     Visit Number 2    Number of Visits 12    Date for Recertification  04/03/24    PT Start Time 0900    PT Stop Time 0945    PT Time Calculation (min) 45 min    Equipment Utilized During Treatment Gait belt    Activity Tolerance Patient tolerated treatment well;No increased pain;Patient limited by fatigue    Behavior During Therapy [facility] for tasks assessed/performed          Past Medical History:  Diagnosis Date   Alcohol abuse, in remission    Hyperlipidemia    Hypertension    Spinal stenosis in cervical region    Past Surgical History:  Procedure Laterality Date   CARDIOVASCULAR STRESS TEST  2006   normal, during admission for chest pain,  [place]    COLONOSCOPY WITH PROPOFOL  N/A 05/08/2019   Procedure: COLONOSCOPY WITH PROPOFOL ;  Surgeon: Toledo, Ladell POUR, MD;  Location: [facility] ENDOSCOPY;  Service: Gastroenterology;  Laterality: N/A;   HERNIA REPAIR  1996   right inguinal hernia repair with mesh   SMALL INTESTINE SURGERY     cerivcal disk fusion   Patient Active Problem List   Diagnosis Date Noted   Hypertension 01/05/2012   Hyperlipidemia with target low density lipoprotein (LDL) cholesterol less than 100 mg/dL 91/86/7986   Arrhythmia 01/05/2012   Chronic back pain greater than 3 months duration 01/05/2012   Spinal stenosis in cervical region    Alcohol abuse, in remission     PCP: Delfina Pao, MD  REFERRING PROVIDER: Ewing, Lamar RIGGERS  REFERRING DIAG:  M17.11 (ICD-10-CM) - Unilateral primary osteoarthritis, right knee  M25.561 (ICD-10-CM) - Pain in right knee  G89.29 (ICD-10-CM) - Other chronic pain  R26.81 (ICD-10-CM) - Unsteadiness on feet    THERAPY DIAG:  Chronic pain of right knee  Muscle weakness (generalized)  Other abnormalities of  gait and mobility  Unilateral osteoarthritis of knee  Acquired leg length discrepancy  Rationale for Evaluation and Treatment: Rehabilitation  ONSET DATE: ~August 2025  SUBJECTIVE:   SUBJECTIVE STATEMENT: Pt is a 78 year old male who presents to physical therapy with concerns of chronic right knee pain and general lower extremity strength/endurance concerns affecting his functional independence. Pt has 0/10 right knee pain at rest but shooting pain can raise to a 10/10 with standing for long periods of time at church or walking for long periods of time in the community with his wife. Pt states that he feels fatigued with these activities and has noticed occasional instances of his right kne giving out. Pt reports that this is a chronic issue that has started irritating him more within the past two months. Pain is alleviated with rest and 1 week usage of right knee brace may be helping too, I'm not sure. Pt reports an allergy to NSAIDs so he is unable to take them for pain management at this time.   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 Pt reports having 4 metal rods placed in his right ankle which is why he has been wearing a brace over his right ankle for years. Pt has not used orthotic in right sneaker but does report that his left leg is longer than my right. Pt ambulating with SPC in clinic on  this day but states that this is new for him and that he has been walking at home without a device for the most part. Pt reports being independent at home and participates in yoga twice a week for general stretching/core strengthening.  PAIN:  Are you having pain? Yes: NPRS scale: 0/10 current, 10/10 at worst Pain location: General right knee pain Pain description: shooting Aggravating factors: Prolonged standing, prolonged walking, pivoting/turning at home, walking on outdoor surfaces Relieving factors: Rest  PRECAUTIONS: None  RED  FLAGS: None   WEIGHT BEARING RESTRICTIONS: No  FALLS:  Has patient fallen in last 6 months? Yes. Number of falls 1 (outdoors due to multitasking, assisted by family to get back up)  LIVING ENVIRONMENT: Lives with: lives with their spouse Lives in: House/apartment Stairs: No Has following equipment at home: Single point cane  OCCUPATION: Retired  PLOF: Independent  PATIENT GOALS: To decrease shooting pain in right knee and improve lower extremity strength so that he may return to outdoor walking with his wife and dog with greater ease  NEXT MD VISIT: None scheduled currently  OBJECTIVE:  Note: Objective measures were completed at Evaluation unless otherwise noted.  DIAGNOSTIC FINDINGS: N/A.  See care everywhere for [facility] records.  PATIENT SURVEYS:  LEFS: 31/80  COGNITION: Overall cognitive status: Within functional limits for tasks assessed     SENSATION: Light touch: WFL  MUSCLE LENGTH: Hamstrings: Right 75 deg; Left 65 deg   POSTURE: rounded shoulders, forward head, increased thoracic kyphosis, flexed trunk , and weight shift left  PALPATION: No significant tenderness or pain to touch  LOWER EXTREMITY ROM:  Active ROM Right eval Left eval  Hip flexion 94 deg 92 deg  Hip extension    Hip abduction    Hip adduction    Hip internal rotation 18 deg 14 deg  Hip external rotation 24 deg 29 deg  Knee flexion 121 deg 119 deg  Knee extension -5 deg -1 deg  Ankle dorsiflexion    Ankle plantarflexion    Ankle inversion    Ankle eversion     (Blank rows = not tested)  LOWER EXTREMITY MMT:  MMT Right eval Left eval  Hip flexion -4 -4  Hip extension    Hip abduction 4- 4-  Hip adduction 4- 4  Hip internal rotation 4- 4-  Hip external rotation 4- 4-  Knee flexion 4- 4-  Knee extension 4- 4+  Ankle dorsiflexion NT 4+  Ankle plantarflexion    Ankle inversion    Ankle eversion     (Blank rows = not tested)  LOWER EXTREMITY SPECIAL TESTS:  FADDIR:  Negative SLR: Negative   FUNCTIONAL TESTS:  5xSTS: 24.47 seconds (with bilateral UE support from chair arms)  GAIT: Distance walked: 40 ft around clinic Assistive device utilized: Single point cane and None Level of assistance: Modified independence Comments: Pt ambulated with SPC in RUE for support and was observed to have wide BOS with SPC and lateral trunk lean to left side. Pt ambulated with bilateral shortened step length, forward head posture and rounded shoulders, and decreased cadence. Pt ambulated with a step to gait pattern and decreased hip extension noted during toe off phase. Pt with significant right knee valgus noted during gait and during static stance.    Leg Length from umbilicus: symmetrical bilaterally Right leg length from ASIS: 103 cm (decreased right knee flexion and knee valgus present) Left leg length from ASIS: 100 cm  TREATMENT DATE: 02/29/2024   Subjective: Pt reports 0/10 right knee pain at start of treatment session. Pt presents to physical therapy with SPC usage in RUE, right knee brace, right ankle orthotic, and noticeable bruising over his left knee and forehead from fall in community which occurred last week on 10/02. Pt reports no residual dizziness or headaches from fall but is concerned about his ability to maintain balance at this time. Pt states that he has been unable to complete any of his home exercises due to the situation.   Manual Therapy: No charge Supine bilateral Hamstring stretch  Therapeutic Exercise:  Supine Marches, 2x10 (demonstrated good ability to perform) Seated Marches, 2x10  Therapeutic Activity:  BERG: 43/50  FGA 10/30 (without use of an assistive device) mCTSIB: significant sway with EO and EC on foam pad  Right ankle AROM: limited in all directions (extensive previous surgical history)- assessed brace  fit/ ankle mobility.    Pt educated on importance of HEP, continued participation in community activities such as walking and yoga, and continued use of SPC with community ambulation at this time for balance (instructed to use SPC in RUE at this time until further assessment and balance/gait training during upcoming sessions can be had).  PATIENT EDUCATION:  Education details: Anatomy involved, prognosis, POC, HEP Person educated: Patient Education method: Explanation, Demonstration, Tactile cues, Verbal cues, and Handouts Education comprehension: verbalized understanding and returned demonstration  HOME EXERCISE PROGRAM: Access Code: [email or web address] URL: [email or web address] Date: 02/21/2024 Prepared by: Ozell Sero Exercises - Standing Hip Extension with Counter Support - 1 x daily - 7 x weekly - 2 sets - 12 reps - Standing Hip Abduction with Counter Support - 1 x daily - 7 x weekly - 2 sets - 12 reps - Standing Hamstring Stretch with Step - 1 x daily - 7 x weekly - 3 sets - 10 reps - 20 sec. hold - Supine March - 1 x daily - 7 x weekly - 2 sets - 10 reps   ASSESSMENT:  CLINICAL IMPRESSION: Pt presents to physical therapy with mild right knee pain at start of tx session which did not increase at end of session. Pt educated on the importance of HEP, continued participation in community activities such as walking and yoga, and continued use of SPC with community ambulation at this time for balance assistance. Berg scored at 43/50 on this day and FGA scored at 10/30 which is below the cutoff for classifying and predicting fall risk in older adults. Both assessments were made without use of [facility] which patient has only been using in the community if it is somewhere unknown. Pt was instructed to use SPC in RUE at this time since he is most comfortable with this, until further balance/gait training with physical therapy can be had. Assessed right foot orthotic and right foot AROM on this day  (limited in all directions) and discussed potential for acquiring a new orthotic. Will continue to monitor pt symptoms and progress as able.   OBJECTIVE IMPAIRMENTS: Abnormal gait, decreased activity tolerance, decreased balance, decreased endurance, decreased knowledge of use of DME, difficulty walking, decreased ROM, decreased strength, postural dysfunction, and pain.   ACTIVITY LIMITATIONS: carrying, lifting, standing, and transfers  PARTICIPATION LIMITATIONS: cleaning, laundry, and community activity  PERSONAL FACTORS: Age and 1-2 comorbidities: hypertension and chronic low back pain are also affecting patient's functional outcome.   REHAB POTENTIAL: Good  CLINICAL DECISION MAKING: Evolving/moderate complexity  EVALUATION COMPLEXITY: High   GOALS: Goals reviewed with patient? Yes  SHORT TERM GOALS: Target date: 03/13/2024 Pt to increase gross bilateral hip strength to at least a 4/5 in order to improve ability to stand for prolonged periods of time which is required for attending church services.  Baseline:see above Goal status: INITIAL  2.  Pt to experience no greater than 6/10 right knee pain at it's worst to improve ability to complete household tasks with greater ease.  Baseline: 10/10 at worst Goal status: INITIAL  LONG TERM GOALS: Target date: 04/03/2024  Pt to increase 5xSTS score to at least 15 seconds without the use of bilateral upper extremity support a to improve independence with functional transfers and decrease fall risk.  Baseline: 24.47 seconds with bilateral UE usage Goal status: INITIAL  2.  Pt to increase LEFs score to at least 49/80 to improve self-confidence of functional abilities with daily activities.  Baseline: 31/80 Goal status: INITIAL  3.  Pt to improve gait mechanics without significant valgus of right knee and an improved upright posture with the appropriately chosen assistive device so that he experiences greater ease with ambulating long  community distances with his wife.  Baseline: see above  Goal status: INITIAL   PLAN:  PT FREQUENCY: 2x/week  PT DURATION: 6 weeks  PLANNED INTERVENTIONS: 97164- PT Re-evaluation, 97110-Therapeutic exercises, 97530- Therapeutic activity, 97112- Neuromuscular re-education, 97535- Self Care, 02859- Manual therapy, Patient/Family education, Balance training, Cryotherapy, and Moist heat  PLAN FOR NEXT SESSION: Continue gross LE strengthening for carryover for functional gait activities.    Ozell JAYSON Sero, PT, DPT # [id number] Curtistine Bracket, Student-PT 02/29/2024, 2:36 PM

## 2024-03-01 ENCOUNTER — Encounter: Payer: Self-pay | Admitting: Physical Therapy

## 2024-03-02 ENCOUNTER — Encounter: Payer: Self-pay | Admitting: Physical Therapy

## 2024-03-02 ENCOUNTER — Ambulatory Visit: Admitting: Physical Therapy

## 2024-03-02 DIAGNOSIS — G8929 Other chronic pain: Secondary | ICD-10-CM

## 2024-03-02 DIAGNOSIS — M2170 Unequal limb length (acquired), unspecified site: Secondary | ICD-10-CM

## 2024-03-02 DIAGNOSIS — M25561 Pain in right knee: Secondary | ICD-10-CM | POA: Diagnosis not present

## 2024-03-02 DIAGNOSIS — M6281 Muscle weakness (generalized): Secondary | ICD-10-CM

## 2024-03-02 DIAGNOSIS — M1710 Unilateral primary osteoarthritis, unspecified knee: Secondary | ICD-10-CM

## 2024-03-02 DIAGNOSIS — R2689 Other abnormalities of gait and mobility: Secondary | ICD-10-CM

## 2024-03-02 NOTE — Therapy (Signed)
 OUTPATIENT PHYSICAL THERAPY LOWER EXTREMITY TREATMENT  Patient Name: Taylor Mcbride MRN: [id number] DOB:12/03/1945, 78 y.o., male Today's Date: 03/03/2024  END OF SESSION:  PT End of Session - 03/02/24 0859     Visit Number 3    Number of Visits 12    Date for Recertification  04/03/24    PT Start Time 0859    PT Stop Time 0946    PT Time Calculation (min) 47 min    Equipment Utilized During Treatment Gait belt    Activity Tolerance Patient tolerated treatment well;No increased pain;Patient limited by fatigue    Behavior During Therapy [facility] for tasks assessed/performed          Past Medical History:  Diagnosis Date   Alcohol abuse, in remission    Hyperlipidemia    Hypertension    Spinal stenosis in cervical region    Past Surgical History:  Procedure Laterality Date   CARDIOVASCULAR STRESS TEST  2006   normal, during admission for chest pain,  [place]    COLONOSCOPY WITH PROPOFOL  N/A 05/08/2019   Procedure: COLONOSCOPY WITH PROPOFOL ;  Surgeon: Toledo, Ladell POUR, MD;  Location: [facility] ENDOSCOPY;  Service: Gastroenterology;  Laterality: N/A;   HERNIA REPAIR  1996   right inguinal hernia repair with mesh   SMALL INTESTINE SURGERY     cerivcal disk fusion   Patient Active Problem List   Diagnosis Date Noted   Hypertension 01/05/2012   Hyperlipidemia with target low density lipoprotein (LDL) cholesterol less than 100 mg/dL 91/86/7986   Arrhythmia 01/05/2012   Chronic back pain greater than 3 months duration 01/05/2012   Spinal stenosis in cervical region    Alcohol abuse, in remission     PCP: Delfina Pao, MD  REFERRING PROVIDER: Ewing, Lamar RIGGERS  REFERRING DIAG:  M17.11 (ICD-10-CM) - Unilateral primary osteoarthritis, right knee  M25.561 (ICD-10-CM) - Pain in right knee  G89.29 (ICD-10-CM) - Other chronic pain  R26.81 (ICD-10-CM) - Unsteadiness on feet    THERAPY DIAG:  Chronic pain of right knee  Muscle weakness (generalized)  Other abnormalities of  gait and mobility  Unilateral osteoarthritis of knee  Acquired leg length discrepancy  Rationale for Evaluation and Treatment: Rehabilitation  ONSET DATE: ~August 2025  SUBJECTIVE:   SUBJECTIVE STATEMENT: Pt is a 78 year old male who presents to physical therapy with concerns of chronic right knee pain and general lower extremity strength/endurance concerns affecting his functional independence. Pt has 0/10 right knee pain at rest but shooting pain can raise to a 10/10 with standing for long periods of time at church or walking for long periods of time in the community with his wife. Pt states that he feels fatigued with these activities and has noticed occasional instances of his right kne giving out. Pt reports that this is a chronic issue that has started irritating him more within the past two months. Pain is alleviated with rest and 1 week usage of right knee brace may be helping too, I'm not sure. Pt reports an allergy to NSAIDs so he is unable to take them for pain management at this time.   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 Pt reports having 4 metal rods placed in his right ankle which is why he has been wearing a brace over his right ankle for years. Pt has not used orthotic in right sneaker but does report that his left leg is longer than my right. Pt ambulating with SPC in clinic on  this day but states that this is new for him and that he has been walking at home without a device for the most part. Pt reports being independent at home and participates in yoga twice a week for general stretching/core strengthening.  PAIN:  Are you having pain? Yes: NPRS scale: 0/10 current, 10/10 at worst Pain location: General right knee pain Pain description: shooting Aggravating factors: Prolonged standing, prolonged walking, pivoting/turning at home, walking on outdoor surfaces Relieving factors: Rest  PRECAUTIONS: None  RED  FLAGS: None   WEIGHT BEARING RESTRICTIONS: No  FALLS:  Has patient fallen in last 6 months? Yes. Number of falls 1 (outdoors due to multitasking, assisted by family to get back up)  LIVING ENVIRONMENT: Lives with: lives with their spouse Lives in: House/apartment Stairs: No Has following equipment at home: Single point cane  OCCUPATION: Retired  PLOF: Independent  PATIENT GOALS: To decrease shooting pain in right knee and improve lower extremity strength so that he may return to outdoor walking with his wife and dog with greater ease  NEXT MD VISIT: None scheduled currently  OBJECTIVE:  Note: Objective measures were completed at Evaluation unless otherwise noted.  DIAGNOSTIC FINDINGS: N/A.  See care everywhere for [facility] records.  PATIENT SURVEYS:  LEFS: 31/80  COGNITION: Overall cognitive status: Within functional limits for tasks assessed     SENSATION: Light touch: WFL  MUSCLE LENGTH: Hamstrings: Right 75 deg; Left 65 deg   POSTURE: rounded shoulders, forward head, increased thoracic kyphosis, flexed trunk , and weight shift left  PALPATION: No significant tenderness or pain to touch  LOWER EXTREMITY ROM:  Active ROM Right eval Left eval  Hip flexion 94 deg 92 deg  Hip extension    Hip abduction    Hip adduction    Hip internal rotation 18 deg 14 deg  Hip external rotation 24 deg 29 deg  Knee flexion 121 deg 119 deg  Knee extension -5 deg -1 deg  Ankle dorsiflexion    Ankle plantarflexion    Ankle inversion    Ankle eversion     (Blank rows = not tested)  LOWER EXTREMITY MMT:  MMT Right eval Left eval  Hip flexion -4 -4  Hip extension    Hip abduction 4- 4-  Hip adduction 4- 4  Hip internal rotation 4- 4-  Hip external rotation 4- 4-  Knee flexion 4- 4-  Knee extension 4- 4+  Ankle dorsiflexion NT 4+  Ankle plantarflexion    Ankle inversion    Ankle eversion     (Blank rows = not tested)  LOWER EXTREMITY SPECIAL TESTS:  FADDIR:  Negative SLR: Negative   FUNCTIONAL TESTS:  5xSTS: 24.47 seconds (with bilateral UE support from chair arms)  GAIT: Distance walked: 40 ft around clinic Assistive device utilized: Single point cane and None Level of assistance: Modified independence Comments: Pt ambulated with SPC in RUE for support and was observed to have wide BOS with SPC and lateral trunk lean to left side. Pt ambulated with bilateral shortened step length, forward head posture and rounded shoulders, and decreased cadence. Pt ambulated with a step to gait pattern and decreased hip extension noted during toe off phase. Pt with significant right knee valgus noted during gait and during static stance.    Leg Length from umbilicus: symmetrical bilaterally Right leg length from ASIS: 103 cm (decreased right knee flexion and knee valgus present) Left leg length from ASIS: 100 cm  BERG: 43/50  FGA 10/30 (without use of  an assistive device) mCTSIB: significant sway with EO and EC on foam pad                                                                                                                               TREATMENT DATE: 03/03/2024    Subjective: Pt reports 0/10 right knee pain at start of treatment session. Pt presents to physical therapy with SPC usage in RUE, right knee brace and right ankle orthotic.  No new falls or complaints since last PT tx. Session.    Therapeutic Exercise:   Walking marches in //-bars (attempted to added alt. UE/LE touches but unable).  Lateral walking in //-bars with light to no UE assist.  4 laps each.  Seated Marches/ LAQ 20x each.  Seated/ standing hamstring stretches (cuing for proper technique/ static holds).    Nustep L3 B UE/LE (seat position 11):  6 minutes with no rest breaks.    Therapeutic Activity:   Walking in hallway with use of SPC (posture correction).  Several laps before/ after tx. Session.    Walking in //-bars with added 6 hurdles (use of B UE and  progressing to no UE assist).  Benefits from leading with R LE during L SLS.    Sit to stands with no UE assist (extra time to complete safely).    Pt educated on importance of HEP, continued participation in community activities such as walking and yoga, and continued use of SPC with community ambulation at this time for balance (instructed to use SPC in RUE at this time until further assessment and balance/gait training during upcoming sessions can be had).  Pt. Has Yoga class at [facility] with wife after PT tx. Session.  Discussed using N[facility]ep at [facility] for future carry over after completion of PT  PATIENT EDUCATION:  Education details: Anatomy involved, prognosis, POC, HEP Person educated: Patient Education method: Explanation, Demonstration, Tactile cues, Verbal cues, and Handouts Education comprehension: verbalized understanding and returned demonstration  HOME EXERCISE PROGRAM: Access Code: [email or web address] URL: [email or web address] Date: 02/21/2024 Prepared by: Ozell Sero Exercises - Standing Hip Extension with Counter Support - 1 x daily - 7 x weekly - 2 sets - 12 reps - Standing Hip Abduction with Counter Support - 1 x daily - 7 x weekly - 2 sets - 12 reps - Standing Hamstring Stretch with Step - 1 x daily - 7 x weekly - 3 sets - 10 reps - 20 sec. hold - Supine March - 1 x daily - 7 x weekly - 2 sets - 10 reps   ASSESSMENT:  CLINICAL IMPRESSION: Pt. Requires moderate cuing during standing tasks to correct posture/ proper technique with sit to stands.  No LOB during tx. Session but extra time/ focus with consistent step pattern/ use of SPC.  Pt. Prefers use of SPC on R side with 2-point gait pattern. Pt. Unable to complete coordination tasks with alt. UE/LE touches during marching in //-bars.  PT  reached out to Garrel Mulch, orthotist at [place], to discuss right foot orthotic and discussed potential for acquiring a new orthotic. Will continue to monitor pt symptoms and progress as able.    OBJECTIVE IMPAIRMENTS: Abnormal gait, decreased activity tolerance, decreased balance, decreased endurance, decreased knowledge of use of DME, difficulty walking, decreased ROM, decreased strength, postural dysfunction, and pain.   ACTIVITY LIMITATIONS: carrying, lifting, standing, and transfers  PARTICIPATION LIMITATIONS: cleaning, laundry, and community activity  PERSONAL FACTORS: Age and 1-2 comorbidities: hypertension and chronic low back pain are also affecting patient's functional outcome.   REHAB POTENTIAL: Good  CLINICAL DECISION MAKING: Evolving/moderate complexity  EVALUATION COMPLEXITY: High   GOALS: Goals reviewed with patient? Yes  SHORT TERM GOALS: Target date: 03/13/2024 Pt to increase gross bilateral hip strength to at least a 4/5 in order to improve ability to stand for prolonged periods of time which is required for attending church services.  Baseline:see above Goal status: INITIAL  2.  Pt to experience no greater than 6/10 right knee pain at it's worst to improve ability to complete household tasks with greater ease.  Baseline: 10/10 at worst Goal status: INITIAL  LONG TERM GOALS: Target date: 04/03/2024  Pt to increase 5xSTS score to at least 15 seconds without the use of bilateral upper extremity support a to improve independence with functional transfers and decrease fall risk.  Baseline: 24.47 seconds with bilateral UE usage Goal status: INITIAL  2.  Pt to increase LEFs score to at least 49/80 to improve self-confidence of functional abilities with daily activities.  Baseline: 31/80 Goal status: INITIAL  3.  Pt to improve gait mechanics without significant valgus of right knee and an improved upright posture with the appropriately chosen assistive device so that he experiences greater ease with ambulating  distances with his wife.  Baseline: see above  Goal status: INITIAL   PLAN:  PT FREQUENCY: 2x/week  PT DURATION: 6  weeks  PLANNED INTERVENTIONS: 97164- PT Re-evaluation, 97110-Therapeutic exercises, 97530- Therapeutic activity, 97112- Neuromuscular re-education, 97535- Self Care, [id number]- Manual therapy, Patient/Family education, Balance training, Cryotherapy, and Moist heat  PLAN FOR NEXT SESSION: Continue gross LE strengthening for carryover for functional gait activities.    Ozell JAYSON Sero, PT, DPT # [phone] 03/03/2024, 7:57 AM

## 2024-03-06 ENCOUNTER — Ambulatory Visit: Admitting: Physical Therapy

## 2024-03-09 ENCOUNTER — Ambulatory Visit: Admitting: Physical Therapy

## 2024-03-09 DIAGNOSIS — M1710 Unilateral primary osteoarthritis, unspecified knee: Secondary | ICD-10-CM

## 2024-03-09 DIAGNOSIS — M6281 Muscle weakness (generalized): Secondary | ICD-10-CM

## 2024-03-09 DIAGNOSIS — M25561 Pain in right knee: Secondary | ICD-10-CM | POA: Diagnosis not present

## 2024-03-09 DIAGNOSIS — R2689 Other abnormalities of gait and mobility: Secondary | ICD-10-CM

## 2024-03-09 DIAGNOSIS — G8929 Other chronic pain: Secondary | ICD-10-CM

## 2024-03-09 DIAGNOSIS — M2170 Unequal limb length (acquired), unspecified site: Secondary | ICD-10-CM

## 2024-03-09 NOTE — Therapy (Signed)
 OUTPATIENT PHYSICAL THERAPY LOWER EXTREMITY TREATMENT  Patient Name: Taylor Mcbride MRN: [id number] DOB:12-11-1945, 78 y.o., male Today's Date: 03/09/2024  END OF SESSION:  PT End of Session - 03/09/24 0956     Visit Number 4    Number of Visits 12    Date for Recertification  04/03/24    PT Start Time 0900    PT Stop Time 0946    PT Time Calculation (min) 46 min    Equipment Utilized During Treatment Gait belt    Activity Tolerance Patient tolerated treatment well;No increased pain;Patient limited by fatigue    Behavior During Therapy [facility] for tasks assessed/performed           Past Medical History:  Diagnosis Date   Alcohol abuse, in remission    Hyperlipidemia    Hypertension    Spinal stenosis in cervical region    Past Surgical History:  Procedure Laterality Date   CARDIOVASCULAR STRESS TEST  2006   normal, during admission for chest pain,  [place]    COLONOSCOPY WITH PROPOFOL  N/A 05/08/2019   Procedure: COLONOSCOPY WITH PROPOFOL ;  Surgeon: Toledo, Ladell POUR, MD;  Location: [facility] ENDOSCOPY;  Service: Gastroenterology;  Laterality: N/A;   HERNIA REPAIR  1996   right inguinal hernia repair with mesh   SMALL INTESTINE SURGERY     cerivcal disk fusion   Patient Active Problem List   Diagnosis Date Noted   Hypertension 01/05/2012   Hyperlipidemia with target low density lipoprotein (LDL) cholesterol less than 100 mg/dL 91/86/7986   Arrhythmia 01/05/2012   Chronic back pain greater than 3 months duration 01/05/2012   Spinal stenosis in cervical region    Alcohol abuse, in remission     PCP: Delfina Pao, MD  REFERRING PROVIDER: Ewing, Lamar RIGGERS  REFERRING DIAG:  M17.11 (ICD-10-CM) - Unilateral primary osteoarthritis, right knee  M25.561 (ICD-10-CM) - Pain in right knee  G89.29 (ICD-10-CM) - Other chronic pain  R26.81 (ICD-10-CM) - Unsteadiness on feet    THERAPY DIAG:  Chronic pain of right knee  Muscle weakness (generalized)  Other abnormalities  of gait and mobility  Unilateral osteoarthritis of knee  Acquired leg length discrepancy  Rationale for Evaluation and Treatment: Rehabilitation  ONSET DATE: ~August 2025  SUBJECTIVE:   SUBJECTIVE STATEMENT: Pt is a 78 year old male who presents to physical therapy with concerns of chronic right knee pain and general lower extremity strength/endurance concerns affecting his functional independence. Pt has 0/10 right knee pain at rest but shooting pain can raise to a 10/10 with standing for long periods of time at church or walking for long periods of time in the community with his wife. Pt states that he feels fatigued with these activities and has noticed occasional instances of his right kne giving out. Pt reports that this is a chronic issue that has started irritating him more within the past two months. Pain is alleviated with rest and 1 week usage of right knee brace may be helping too, I'm not sure. Pt reports an allergy to NSAIDs so he is unable to take them for pain management at this time.   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 Pt reports having 4 metal rods placed in his right ankle which is why he has been wearing a brace over his right ankle for years. Pt has not used orthotic in right sneaker but does report that his left leg is longer than my right. Pt ambulating with SPC in clinic  on this day but states that this is new for him and that he has been walking at home without a device for the most part. Pt reports being independent at home and participates in yoga twice a week for general stretching/core strengthening.  PAIN:  Are you having pain? Yes: NPRS scale: 0/10 current, 10/10 at worst Pain location: General right knee pain Pain description: shooting Aggravating factors: Prolonged standing, prolonged walking, pivoting/turning at home, walking on outdoor surfaces Relieving factors: Rest  PRECAUTIONS: None  RED  FLAGS: None   WEIGHT BEARING RESTRICTIONS: No  FALLS:  Has patient fallen in last 6 months? Yes. Number of falls 1 (outdoors due to multitasking, assisted by family to get back up)  LIVING ENVIRONMENT: Lives with: lives with their spouse Lives in: House/apartment Stairs: No Has following equipment at home: Single point cane  OCCUPATION: Retired  PLOF: Independent  PATIENT GOALS: To decrease shooting pain in right knee and improve lower extremity strength so that he may return to outdoor walking with his wife and dog with greater ease  NEXT MD VISIT: None scheduled currently  OBJECTIVE:  Note: Objective measures were completed at Evaluation unless otherwise noted.  DIAGNOSTIC FINDINGS: N/A.  See care everywhere for [facility] records.  PATIENT SURVEYS:  LEFS: 31/80  COGNITION: Overall cognitive status: Within functional limits for tasks assessed     SENSATION: Light touch: WFL  MUSCLE LENGTH: Hamstrings: Right 75 deg; Left 65 deg   POSTURE: rounded shoulders, forward head, increased thoracic kyphosis, flexed trunk , and weight shift left  PALPATION: No significant tenderness or pain to touch  LOWER EXTREMITY ROM:  Active ROM Right eval Left eval  Hip flexion 94 deg 92 deg  Hip extension    Hip abduction    Hip adduction    Hip internal rotation 18 deg 14 deg  Hip external rotation 24 deg 29 deg  Knee flexion 121 deg 119 deg  Knee extension -5 deg -1 deg  Ankle dorsiflexion    Ankle plantarflexion    Ankle inversion    Ankle eversion     (Blank rows = not tested)  LOWER EXTREMITY MMT:  MMT Right eval Left eval  Hip flexion -4 -4  Hip extension    Hip abduction 4- 4-  Hip adduction 4- 4  Hip internal rotation 4- 4-  Hip external rotation 4- 4-  Knee flexion 4- 4-  Knee extension 4- 4+  Ankle dorsiflexion NT 4+  Ankle plantarflexion    Ankle inversion    Ankle eversion     (Blank rows = not tested)  LOWER EXTREMITY SPECIAL TESTS:  FADDIR:  Negative SLR: Negative   FUNCTIONAL TESTS:  5xSTS: 24.47 seconds (with bilateral UE support from chair arms)  GAIT: Distance walked: 40 ft around clinic Assistive device utilized: Single point cane and None Level of assistance: Modified independence Comments: Pt ambulated with SPC in RUE for support and was observed to have wide BOS with SPC and lateral trunk lean to left side. Pt ambulated with bilateral shortened step length, forward head posture and rounded shoulders, and decreased cadence. Pt ambulated with a step to gait pattern and decreased hip extension noted during toe off phase. Pt with significant right knee valgus noted during gait and during static stance.    Leg Length from umbilicus: symmetrical bilaterally Right leg length from ASIS: 103 cm (decreased right knee flexion and knee valgus present) Left leg length from ASIS: 100 cm  BERG: 43/50  FGA 10/30 (without use  of an assistive device) mCTSIB: significant sway with EO and EC on foam pad                                                                                                                               TREATMENT DATE: 03/09/2024    Subjective: Pt reports 0/10 right knee pain at start of treatment session but notes that he took medication this morning before arriving to prevent pain from increasing. Pt presents to physical therapy with SPC usage in RUE, right knee brace and right ankle orthotic.  No new falls or concerns since last treatment session.    Therapeutic Exercise:   Standing marches in //-bars   Lateral walking in //-bars with light to no UE assist.  4 laps each.  Seated Marches and LAQs, 20x each  Therapeutic Activity:   Walking in hallway with and without use of SPC ~70 feet each  Walking in //-bars with added 6 hurdles (use of RUE on // bars).  Increased difficulty when leading over hurdles with LLE, 4 x down and back  Sit to stands with no UE assist (from blue mat table height to focus  on symmetrical WB through bilateral LEs).    Walking over uneven surface (blue mat with AWs underneath) in // bars (use of RUE on HR with progression to without UE support), 3 x down and back  Standing on foam pad with ball toss, 2 x 40 seconds  Standing on foam pad with random Blaze pod tapping overhead, 3 x 45 seconds  Not performed on this day:  Seated/ standing hamstring stretches (cuing for proper technique/ static holds).    Nustep L3 B UE/LE (seat position 11):  6 minutes with no rest breaks.  PATIENT EDUCATION:  Education details: Anatomy involved, prognosis, POC, HEP, importance of continued participation in community activities such as walking and yoga Person educated: Patient Education method: Explanation, Demonstration, Tactile cues, Verbal cues, and Handouts Education comprehension: verbalized understanding and returned demonstration  HOME EXERCISE PROGRAM: Access Code: [email or web address] URL: [email or web address] Date: 02/21/2024 Prepared by: Ozell Sero Exercises - Standing Hip Extension with Counter Support - 1 x daily - 7 x weekly - 2 sets - 12 reps - Standing Hip Abduction with Counter Support - 1 x daily - 7 x weekly - 2 sets - 12 reps - Standing Hamstring Stretch with Step - 1 x daily - 7 x weekly - 3 sets - 10 reps - 20 sec. hold - Supine March - 1 x daily - 7 x weekly - 2 sets - 10 reps   ASSESSMENT:  CLINICAL IMPRESSION: Pt continues to require frequent verbal and tactile cueing to maintain upright posture during standing and ambulation activities which he is able to correct initially but with tendency to lose posture with fatigue. Pt with improved gait mechanics when walking in hallway with SPC as compared to without an AD as he is able to take increased step length  of RLE, maintain upright posture, and increased gait speed. Pt with tendency to use LUE for support during overhead Blaze Pod reaches while standing on foam pad but was eventually able to take away  LUE support and demonstrated good ability to reach outside BOS without LOB. Pt did require CGA from therapist with all standing and dynamic ambulatory activities to maintain balance but did require min A and mod A from therapist at times during standing ball toss on foam pad to prevent LOB posteriorly. Pt reported 0/10 pain in right knee at end of tx session. Will continue to monitor and progress as able.    OBJECTIVE IMPAIRMENTS: Abnormal gait, decreased activity tolerance, decreased balance, decreased endurance, decreased knowledge of use of DME, difficulty walking, decreased ROM, decreased strength, postural dysfunction, and pain.   ACTIVITY LIMITATIONS: carrying, lifting, standing, and transfers  PARTICIPATION LIMITATIONS: cleaning, laundry, and community activity  PERSONAL FACTORS: Age and 1-2 comorbidities: hypertension and chronic low back pain are also affecting patient's functional outcome.   REHAB POTENTIAL: Good  CLINICAL DECISION MAKING: Evolving/moderate complexity  EVALUATION COMPLEXITY: High   GOALS: Goals reviewed with patient? Yes  SHORT TERM GOALS: Target date: 03/13/2024 Pt to increase gross bilateral hip strength to at least a 4/5 in order to improve ability to stand for prolonged periods of time which is required for attending church services.  Baseline:see above Goal status: INITIAL  2.  Pt to experience no greater than 6/10 right knee pain at it's worst to improve ability to complete household tasks with greater ease.  Baseline: 10/10 at worst Goal status: INITIAL  LONG TERM GOALS: Target date: 04/03/2024  Pt to increase 5xSTS score to at least 15 seconds without the use of bilateral upper extremity support a to improve independence with functional transfers and decrease fall risk.  Baseline: 24.47 seconds with bilateral UE usage Goal status: INITIAL  2.  Pt to increase LEFs score to at least 49/80 to improve self-confidence of functional abilities with daily  activities.  Baseline: 31/80 Goal status: INITIAL  3.  Pt to improve gait mechanics without significant valgus of right knee and an improved upright posture with the appropriately chosen assistive device so that he experiences greater ease with ambulating [place] distances with his wife.  Baseline: see above  Goal status: INITIAL   PLAN:  PT FREQUENCY: 2x/week  PT DURATION: 6 weeks  PLANNED INTERVENTIONS: 97164- PT Re-evaluation, 97110-Therapeutic exercises, 97530- Therapeutic activity, 97112- Neuromuscular re-education, 97535- Self Care, [id number]- Manual therapy, Patient/Family education, Balance training, Cryotherapy, and Moist heat  PLAN FOR NEXT SESSION: Continue gross LE strengthening for carryover for functional gait activities. Continue ambulatory activities with SPC.   Curtistine Bracket, SPT  Ozell JAYSON Sero, PT, DPT # [phone] 03/09/2024, 9:57 AM

## 2024-03-14 ENCOUNTER — Ambulatory Visit: Admitting: Physical Therapy

## 2024-03-14 DIAGNOSIS — M1710 Unilateral primary osteoarthritis, unspecified knee: Secondary | ICD-10-CM

## 2024-03-14 DIAGNOSIS — R2689 Other abnormalities of gait and mobility: Secondary | ICD-10-CM

## 2024-03-14 DIAGNOSIS — M6281 Muscle weakness (generalized): Secondary | ICD-10-CM

## 2024-03-14 DIAGNOSIS — M25561 Pain in right knee: Secondary | ICD-10-CM | POA: Diagnosis not present

## 2024-03-14 DIAGNOSIS — G8929 Other chronic pain: Secondary | ICD-10-CM

## 2024-03-14 NOTE — Therapy (Unsigned)
 OUTPATIENT PHYSICAL THERAPY LOWER EXTREMITY TREATMENT  Patient Name: Taylor Mcbride MRN: [id number] DOB:1945/09/24, 78 y.o., male Today's Date: 03/14/2024  END OF SESSION:  PT End of Session - 03/14/24 1114     Visit Number 5    Number of Visits 12    Date for Recertification  04/03/24    PT Start Time 1030    PT Stop Time 1115    PT Time Calculation (min) 45 min    Equipment Utilized During Treatment Gait belt    Activity Tolerance Patient tolerated treatment well;No increased pain;Patient limited by fatigue    Behavior During Therapy [facility] for tasks assessed/performed          Past Medical History:  Diagnosis Date   Alcohol abuse, in remission    Hyperlipidemia    Hypertension    Spinal stenosis in cervical region    Past Surgical History:  Procedure Laterality Date   CARDIOVASCULAR STRESS TEST  2006   normal, during admission for chest pain,  [place]    COLONOSCOPY WITH PROPOFOL  N/A 05/08/2019   Procedure: COLONOSCOPY WITH PROPOFOL ;  Surgeon: Toledo, Ladell POUR, MD;  Location: [facility] ENDOSCOPY;  Service: Gastroenterology;  Laterality: N/A;   HERNIA REPAIR  1996   right inguinal hernia repair with mesh   SMALL INTESTINE SURGERY     cerivcal disk fusion   Patient Active Problem List   Diagnosis Date Noted   Hypertension 01/05/2012   Hyperlipidemia with target low density lipoprotein (LDL) cholesterol less than 100 mg/dL 91/86/7986   Arrhythmia 01/05/2012   Chronic back pain greater than 3 months duration 01/05/2012   Spinal stenosis in cervical region    Alcohol abuse, in remission     PCP: Delfina Pao, MD  REFERRING PROVIDER: Ewing, Lamar RIGGERS  REFERRING DIAG:  M17.11 (ICD-10-CM) - Unilateral primary osteoarthritis, right knee  M25.561 (ICD-10-CM) - Pain in right knee  G89.29 (ICD-10-CM) - Other chronic pain  R26.81 (ICD-10-CM) - Unsteadiness on feet    THERAPY DIAG:  Muscle weakness (generalized)  Chronic pain of right knee  Other abnormalities of  gait and mobility  Unilateral osteoarthritis of knee  Rationale for Evaluation and Treatment: Rehabilitation  ONSET DATE: ~August 2025  SUBJECTIVE:   SUBJECTIVE STATEMENT: Pt is a 78 year old male who presents to physical therapy with concerns of chronic right knee pain and general lower extremity strength/endurance concerns affecting his functional independence. Pt has 0/10 right knee pain at rest but shooting pain can raise to a 10/10 with standing for long periods of time at church or walking for long periods of time in the community with his wife. Pt states that he feels fatigued with these activities and has noticed occasional instances of his right kne giving out. Pt reports that this is a chronic issue that has started irritating him more within the past two months. Pain is alleviated with rest and 1 week usage of right knee brace may be helping too, I'm not sure. Pt reports an allergy to NSAIDs so he is unable to take them for pain management at this time.   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 Pt reports having 4 metal rods placed in his right ankle which is why he has been wearing a brace over his right ankle for years. Pt has not used orthotic in right sneaker but does report that his left leg is longer than my right. Pt ambulating with SPC in clinic on this day but states that  this is new for him and that he has been walking at home without a device for the most part. Pt reports being independent at home and participates in yoga twice a week for general stretching/core strengthening.  PAIN:  Are you having pain? Yes: NPRS scale: 0/10 current, 10/10 at worst Pain location: General right knee pain Pain description: shooting Aggravating factors: Prolonged standing, prolonged walking, pivoting/turning at home, walking on outdoor surfaces Relieving factors: Rest  PRECAUTIONS: None  RED FLAGS: None   WEIGHT BEARING  RESTRICTIONS: No  FALLS:  Has patient fallen in last 6 months? Yes. Number of falls 1 (outdoors due to multitasking, assisted by family to get back up)  LIVING ENVIRONMENT: Lives with: lives with their spouse Lives in: House/apartment Stairs: No Has following equipment at home: Single point cane  OCCUPATION: Retired  PLOF: Independent  PATIENT GOALS: To decrease shooting pain in right knee and improve lower extremity strength so that he may return to outdoor walking with his wife and dog with greater ease  NEXT MD VISIT: None scheduled currently  OBJECTIVE:  Note: Objective measures were completed at Evaluation unless otherwise noted.  DIAGNOSTIC FINDINGS: N/A.  See care everywhere for [facility] records.  PATIENT SURVEYS:  LEFS: 31/80  COGNITION: Overall cognitive status: Within functional limits for tasks assessed     SENSATION: Light touch: WFL  MUSCLE LENGTH: Hamstrings: Right 75 deg; Left 65 deg   POSTURE: rounded shoulders, forward head, increased thoracic kyphosis, flexed trunk , and weight shift left  PALPATION: No significant tenderness or pain to touch  LOWER EXTREMITY ROM:  Active ROM Right eval Left eval  Hip flexion 94 deg 92 deg  Hip extension    Hip abduction    Hip adduction    Hip internal rotation 18 deg 14 deg  Hip external rotation 24 deg 29 deg  Knee flexion 121 deg 119 deg  Knee extension -5 deg -1 deg  Ankle dorsiflexion    Ankle plantarflexion    Ankle inversion    Ankle eversion     (Blank rows = not tested)  LOWER EXTREMITY MMT:  MMT Right eval Left eval  Hip flexion -4 -4  Hip extension    Hip abduction 4- 4-  Hip adduction 4- 4  Hip internal rotation 4- 4-  Hip external rotation 4- 4-  Knee flexion 4- 4-  Knee extension 4- 4+  Ankle dorsiflexion NT 4+  Ankle plantarflexion    Ankle inversion    Ankle eversion     (Blank rows = not tested)  LOWER EXTREMITY SPECIAL TESTS:  FADDIR: Negative SLR: Negative    FUNCTIONAL TESTS:  5xSTS: 24.47 seconds (with bilateral UE support from chair arms)  GAIT: Distance walked: 40 ft around clinic Assistive device utilized: Single point cane and None Level of assistance: Modified independence Comments: Pt ambulated with SPC in RUE for support and was observed to have wide BOS with SPC and lateral trunk lean to left side. Pt ambulated with bilateral shortened step length, forward head posture and rounded shoulders, and decreased cadence. Pt ambulated with a step to gait pattern and decreased hip extension noted during toe off phase. Pt with significant right knee valgus noted during gait and during static stance.    Leg Length from umbilicus: symmetrical bilaterally Right leg length from ASIS: 103 cm (decreased right knee flexion and knee valgus present) Left leg length from ASIS: 100 cm  BERG: 43/50  FGA 10/30 (without use of an assistive device) mCTSIB: significant  sway with EO and EC on foam pad                                                                                                                               TREATMENT DATE: 03/14/2024    Subjective: Pt reports 0/10 right knee pain at start of treatment session but notes that he took medication this morning before arriving to prevent pain from increasing. Pt presents to physical therapy with SPC usage in RUE, right knee brace and right ankle orthotic.  No new falls or concerns since last treatment session.    Therapeutic Exercise:   Lateral walking in //-bars with light to no UE assist.  4 laps each.  Step ups to 6-inch stair, 2 x 12   Therapeutic Activity:   Walking in hallway with and without use of SPC with 6 inch hurdles, foam pad step up ~70 feet total  Sit to stands with no UE assist (from blue mat table height to focus on symmetrical WB through bilateral LEs).    Walking over uneven surfaces outdoors (grass, gravel rocks), ~70 ft total  Standing on foam pad with ball toss, 2  x 40 seconds  Standing on firm surface and foam pad with random Blaze pod tapping overhead, 3 x 45 seconds (requires LUE for balance support on HR despite verbal cues)  Pt educated on potential use of SPC in LUE during ambulatory activities for more efficient gait pattern and increased safety with ambulation but pt is unwilling to attempt at this time.   Not performed on this day:  Seated/ standing hamstring stretches (cuing for proper technique/ static holds).   Nustep L3 B UE/LE (seat position 11):  6 minutes with no rest breaks. Standing marches in //-bars  Seated Marches and [employer], 20x each  PATIENT EDUCATION:  Education details: Anatomy involved, prognosis, POC, HEP, importance of continued participation in community activities such as walking and yoga Person educated: Patient Education method: Explanation, Demonstration, Tactile cues, Verbal cues, and Handouts Education comprehension: verbalized understanding and returned demonstration  HOME EXERCISE PROGRAM: Access Code: [email or web address] URL: [email or web address] Date: 02/21/2024 Prepared by: Ozell Sero Exercises - Standing Hip Extension with Counter Support - 1 x daily - 7 x weekly - 2 sets - 12 reps - Standing Hip Abduction with Counter Support - 1 x daily - 7 x weekly - 2 sets - 12 reps - Standing Hamstring Stretch with Step - 1 x daily - 7 x weekly - 3 sets - 10 reps - 20 sec. hold - Supine March - 1 x daily - 7 x weekly - 2 sets - 10 reps   ASSESSMENT:  CLINICAL IMPRESSION: Pt continues to require frequent verbal and tactile cueing to maintain upright posture during standing and ambulation activities which he is able to correct initially but with tendency to lose upright posture (moves into cervical and lumbar flexion) with fatigue. Pt unwilling to attempt use of SPC in LUE  during ambulatory activities despite education on proper use of device in contralateral UE for more efficient gait pattern and increased safety as  patient feels more comfortable with use in RUE.Pt introduced to outdoor walking over various, uneven surfaces (grass, gravel, dirt) with use of SPC in RUE and CGA from therapist which he tolerated well. Pt required min and mod A at times during standing ball toss while standing on foam pad to prevent LOB in posterior direction and noted to have difficulty correcting posterior weight shift despite verbal and tactile cues. Pt reported 0/10 pain in right knee at end of tx session. Will continue to monitor and progress as able.    OBJECTIVE IMPAIRMENTS: Abnormal gait, decreased activity tolerance, decreased balance, decreased endurance, decreased knowledge of use of DME, difficulty walking, decreased ROM, decreased strength, postural dysfunction, and pain.   ACTIVITY LIMITATIONS: carrying, lifting, standing, and transfers  PARTICIPATION LIMITATIONS: cleaning, laundry, and community activity  PERSONAL FACTORS: Age and 1-2 comorbidities: hypertension and chronic low back pain are also affecting patient's functional outcome.   REHAB POTENTIAL: Good  CLINICAL DECISION MAKING: Evolving/moderate complexity  EVALUATION COMPLEXITY: High   GOALS: Goals reviewed with patient? Yes  SHORT TERM GOALS: Target date: 03/13/2024 Pt to increase gross bilateral hip strength to at least a 4/5 in order to improve ability to stand for prolonged periods of time which is required for attending church services.  Baseline:see above Goal status: On-going  2.  Pt to experience no greater than 6/10 right knee pain at it's worst to improve ability to complete household tasks with greater ease.  Baseline: 10/10 at worst Goal status: On-going  LONG TERM GOALS: Target date: 04/03/2024  Pt to increase 5xSTS score to at least 15 seconds without the use of bilateral upper extremity support a to improve independence with functional transfers and decrease fall risk.  Baseline: 24.47 seconds with bilateral UE usage Goal  status: INITIAL  2.  Pt to increase LEFs score to at least 49/80 to improve self-confidence of functional abilities with daily activities.  Baseline: 31/80 Goal status: INITIAL  3.  Pt to improve gait mechanics without significant valgus of right knee and an improved upright posture with the appropriately chosen assistive device so that he experiences greater ease with ambulating [place] distances with his wife.  Baseline: see above  Goal status: INITIAL   PLAN:  PT FREQUENCY: 2x/week  PT DURATION: 6 weeks  PLANNED INTERVENTIONS: 97164- PT Re-evaluation, 97110-Therapeutic exercises, 97530- Therapeutic activity, 97112- Neuromuscular re-education, 97535- Self Care, [id number]- Manual therapy, Patient/Family education, Balance training, Cryotherapy, and Moist heat  PLAN FOR NEXT SESSION: Continue gross LE strengthening for carryover for functional gait activities. Continue ambulatory activities with SPC. Nu-Step at start of tx session. Lateral resisted walking in // bars. CHECK STGs.  ISSUE MD ORDER TO SEE MIKE NEAL  Curtistine Bracket, SPT  Ozell JAYSON Sero, PT, DPT # [phone] 03/14/2024, 11:15 AM

## 2024-03-15 ENCOUNTER — Encounter: Payer: Self-pay | Admitting: Physical Therapy

## 2024-03-16 ENCOUNTER — Ambulatory Visit: Admitting: Physical Therapy

## 2024-03-16 DIAGNOSIS — M2170 Unequal limb length (acquired), unspecified site: Secondary | ICD-10-CM

## 2024-03-16 DIAGNOSIS — R2689 Other abnormalities of gait and mobility: Secondary | ICD-10-CM

## 2024-03-16 DIAGNOSIS — M6281 Muscle weakness (generalized): Secondary | ICD-10-CM

## 2024-03-16 DIAGNOSIS — M1710 Unilateral primary osteoarthritis, unspecified knee: Secondary | ICD-10-CM

## 2024-03-16 DIAGNOSIS — G8929 Other chronic pain: Secondary | ICD-10-CM

## 2024-03-16 DIAGNOSIS — M25561 Pain in right knee: Secondary | ICD-10-CM | POA: Diagnosis not present

## 2024-03-16 NOTE — Therapy (Signed)
 OUTPATIENT PHYSICAL THERAPY LOWER EXTREMITY TREATMENT  Patient Name: Taylor Mcbride MRN: [id number] DOB:[birth year], 78 y.o., male Today's Date: 03/16/2024  END OF SESSION:  PT End of Session - 03/16/24 0859     Visit Number 6    Number of Visits 12    Date for Recertification  04/03/24    PT Start Time 0900    PT Stop Time 0945    PT Time Calculation (min) 45 min    Equipment Utilized During Treatment Gait belt    Activity Tolerance Patient tolerated treatment well;No increased pain;Patient limited by fatigue    Behavior During Therapy [facility] for tasks assessed/performed         Past Medical History:  Diagnosis Date   Alcohol abuse, in remission    Hyperlipidemia    Hypertension    Spinal stenosis in cervical region    Past Surgical History:  Procedure Laterality Date   CARDIOVASCULAR STRESS TEST  2006   normal, during admission for chest pain,  [place]    COLONOSCOPY WITH PROPOFOL  N/A 05/08/2019   Procedure: COLONOSCOPY WITH PROPOFOL ;  Surgeon: Toledo, Ladell POUR, MD;  Location: [facility] ENDOSCOPY;  Service: Gastroenterology;  Laterality: N/A;   HERNIA REPAIR  1996   right inguinal hernia repair with mesh   SMALL INTESTINE SURGERY     cerivcal disk fusion   Patient Active Problem List   Diagnosis Date Noted   Hypertension 01/05/2012   Hyperlipidemia with target low density lipoprotein (LDL) cholesterol less than 100 mg/dL 91/86/7986   Arrhythmia 01/05/2012   Chronic back pain greater than 3 months duration 01/05/2012   Spinal stenosis in cervical region    Alcohol abuse, in remission     PCP: Delfina Pao, MD  REFERRING PROVIDER: Ewing, Lamar RIGGERS  REFERRING DIAG:  M17.11 (ICD-10-CM) - Unilateral primary osteoarthritis, right knee  M25.561 (ICD-10-CM) - Pain in right knee  G89.29 (ICD-10-CM) - Other chronic pain  R26.81 (ICD-10-CM) - Unsteadiness on feet    THERAPY DIAG:  Muscle weakness (generalized)  Chronic pain of right knee  Other abnormalities of  gait and mobility  Unilateral osteoarthritis of knee  Acquired leg length discrepancy  Rationale for Evaluation and Treatment: Rehabilitation  ONSET DATE: ~August 2025  SUBJECTIVE:   SUBJECTIVE STATEMENT: Pt is a 78 year old male who presents to physical therapy with concerns of chronic right knee pain and general lower extremity strength/endurance concerns affecting his functional independence. Pt has 0/10 right knee pain at rest but shooting pain can raise to a 10/10 with standing for long periods of time at church or walking for long periods of time in the community with his wife. Pt states that he feels fatigued with these activities and has noticed occasional instances of his right kne giving out. Pt reports that this is a chronic issue that has started irritating him more within the past two months. Pain is alleviated with rest and 1 week usage of right knee brace may be helping too, I'm not sure. Pt reports an allergy to NSAIDs so he is unable to take them for pain management at this time.   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 Pt reports having 4 metal rods placed in his right ankle which is why he has been wearing a brace over his right ankle for years. Pt has not used orthotic in right sneaker but does report that his left leg is longer than my right. Pt ambulating with SPC in clinic on this  day but states that this is new for him and that he has been walking at home without a device for the most part. Pt reports being independent at home and participates in yoga twice a week for general stretching/core strengthening.  PAIN:  Are you having pain? Yes: NPRS scale: 0/10 current, 10/10 at worst Pain location: General right knee pain Pain description: shooting Aggravating factors: Prolonged standing, prolonged walking, pivoting/turning at home, walking on outdoor surfaces Relieving factors: Rest  PRECAUTIONS: None  RED  FLAGS: None   WEIGHT BEARING RESTRICTIONS: No  FALLS:  Has patient fallen in last 6 months? Yes. Number of falls 1 (outdoors due to multitasking, assisted by family to get back up)  LIVING ENVIRONMENT: Lives with: lives with their spouse Lives in: House/apartment Stairs: No Has following equipment at home: Single point cane  OCCUPATION: Retired  PLOF: Independent  PATIENT GOALS: To decrease shooting pain in right knee and improve lower extremity strength so that he may return to outdoor walking with his wife and dog with greater ease  NEXT MD VISIT: None scheduled currently  OBJECTIVE:  Note: Objective measures were completed at Evaluation unless otherwise noted.  DIAGNOSTIC FINDINGS: N/A.  See care everywhere for [facility] records.  PATIENT SURVEYS:  LEFS: 31/80  COGNITION: Overall cognitive status: Within functional limits for tasks assessed     SENSATION: Light touch: WFL  MUSCLE LENGTH: Hamstrings: Right 75 deg; Left 65 deg   POSTURE: rounded shoulders, forward head, increased thoracic kyphosis, flexed trunk , and weight shift left  PALPATION: No significant tenderness or pain to touch  LOWER EXTREMITY ROM:  Active ROM Right eval Left eval  Hip flexion 94 deg 92 deg  Hip extension    Hip abduction    Hip adduction    Hip internal rotation 18 deg 14 deg  Hip external rotation 24 deg 29 deg  Knee flexion 121 deg 119 deg  Knee extension -5 deg -1 deg  Ankle dorsiflexion    Ankle plantarflexion    Ankle inversion    Ankle eversion     (Blank rows = not tested)  LOWER EXTREMITY MMT:  MMT Right eval Left eval  Hip flexion -4 -4  Hip extension    Hip abduction 4- 4-  Hip adduction 4- 4  Hip internal rotation 4- 4-  Hip external rotation 4- 4-  Knee flexion 4- 4-  Knee extension 4- 4+  Ankle dorsiflexion NT 4+  Ankle plantarflexion    Ankle inversion    Ankle eversion     (Blank rows = not tested)  LOWER EXTREMITY SPECIAL TESTS:  FADDIR:  Negative SLR: Negative   FUNCTIONAL TESTS:  5xSTS: 24.47 seconds (with bilateral UE support from chair arms)  GAIT: Distance walked: 40 ft around clinic Assistive device utilized: Single point cane and None Level of assistance: Modified independence Comments: Pt ambulated with SPC in RUE for support and was observed to have wide BOS with SPC and lateral trunk lean to left side. Pt ambulated with bilateral shortened step length, forward head posture and rounded shoulders, and decreased cadence. Pt ambulated with a step to gait pattern and decreased hip extension noted during toe off phase. Pt with significant right knee valgus noted during gait and during static stance.    Leg Length from umbilicus: symmetrical bilaterally Right leg length from ASIS: 103 cm (decreased right knee flexion and knee valgus present) Left leg length from ASIS: 100 cm  BERG: 43/50  FGA 10/30 (without use of an  assistive device) mCTSIB: significant sway with EO and EC on foam pad                                                                                                                               TREATMENT DATE: 03/16/2024    Subjective: Pt reports 0/10 right knee pain at start of treatment session but notes that he took medication this morning before arriving to prevent pain from increasing. Pt presents to physical therapy with SPC usage in RUE, right knee brace and right ankle orthotic.  No new falls or concerns since last treatment session. Pt states that he was sore after last session and a little bit still this morning.  Therapeutic Exercise:   Lateral resisted walking in // bars, 2 Black Tbs, 3 x down and back   Therapeutic Activity:  Nustep L2-3 B UE/LE (seat position 8):  6 minutes with no rest breaks.  Walking in // bars with forward and lateral step over 6 inch hurdles, 4 x down and back  Sit to stands from gray chair with UE assist (focus on symmetrical WB through bilateral LEs), 1 x 10    Standing static balance on foam pad, eyes open and closed, narrow BOS, 4 sets to failure  BOSU ball static balance, A/P and M/L weight shifting, 1-2 minutes each  03/16/2024 MMT Right eval Left eval  Hip flexion 4 4  Hip extension    Hip abduction 4- 4-  Hip adduction 4 4    Not performed on this day:  Seated/ standing hamstring stretches (cuing for proper technique/ static holds).   Standing marches in //-bars  Seated Marches and LAQs, 20x each Step ups to 6-inch stair, 2 x 12  Walking over uneven surfaces outdoors (grass, gravel rocks), ~70 ft total Standing on foam pad with ball toss, 2 x 40 seconds Standing on firm surface and foam pad with random Blaze pod tapping overhead, 3 x 45 seconds (requires LUE for balance support on HR despite verbal cues)   PATIENT EDUCATION:  Education details: Anatomy involved, prognosis, POC, HEP, importance of continued participation in community activities such as walking and yoga Person educated: Patient Education method: Explanation, Demonstration, Tactile cues, Verbal cues, and Handouts Education comprehension: verbalized understanding and returned demonstration  HOME EXERCISE PROGRAM: Access Code: [email or web address] URL: [email or web address] Date: 02/21/2024 Prepared by: Ozell Sero Exercises - Standing Hip Extension with Counter Support - 1 x daily - 7 x weekly - 2 sets - 12 reps - Standing Hip Abduction with Counter Support - 1 x daily - 7 x weekly - 2 sets - 12 reps - Standing Hamstring Stretch with Step - 1 x daily - 7 x weekly - 3 sets - 10 reps - 20 sec. hold - Supine March - 1 x daily - 7 x weekly - 2 sets - 10 reps   ASSESSMENT:  CLINICAL IMPRESSION: Pt reported 0/10 pain in right knee at end of tx  session. Pt was introduced to standing balance on Airex pad with narrow BOS (eyes open and closed) and standing BOSU balance and M/L and A/P weightshifting to challenge static balance. Pt frequently used bilateral UE support for  weight shifting tasks but with no LOB noted. Pt was also introduced to resisted lateral walking in // bars against two black TBs and lateral step ups over 6 inch hurdles to challenge bilateral hip strength and endurance. Pt continues to feel more confident with leading with RLE over 6 inch hurdles secondary to motor control deficits of RLE. Pt also observed to use HRs during single leg stance and required frequent verbal cueing to attempt activities with less UE assistance which he is able to correct initially until fatigue resumes. Updated strength measures of bilateral hip MMTs on this day (see above). Will continue to monitor and progress as able.    OBJECTIVE IMPAIRMENTS: Abnormal gait, decreased activity tolerance, decreased balance, decreased endurance, decreased knowledge of use of DME, difficulty walking, decreased ROM, decreased strength, postural dysfunction, and pain.   ACTIVITY LIMITATIONS: carrying, lifting, standing, and transfers  PARTICIPATION LIMITATIONS: cleaning, laundry, and community activity  PERSONAL FACTORS: Age and 1-2 comorbidities: hypertension and chronic low back pain are also affecting patient's functional outcome.   REHAB POTENTIAL: Good  CLINICAL DECISION MAKING: Evolving/moderate complexity  EVALUATION COMPLEXITY: High   GOALS: Goals reviewed with patient? Yes  SHORT TERM GOALS: Target date: 03/13/2024 Pt to increase gross bilateral hip strength to at least a 4/5 in order to improve ability to stand for prolonged periods of time which is required for attending church services.  Baseline:see above. 10/23: 4/5 bilaterally and 4-/5 bilateral hip abduction Goal status: On-going  2.  Pt to experience no greater than 6/10 right knee pain at it's worst to improve ability to complete household tasks with greater ease.  Baseline: 10/10 at worst. 10/23: 6/10 Goal status: GOAL MET  LONG TERM GOALS: Target date: 04/03/2024  Pt to increase 5xSTS score to at least 15  seconds without the use of bilateral upper extremity support a to improve independence with functional transfers and decrease fall risk.  Baseline: 24.47 seconds with bilateral UE usage Goal status: INITIAL  2.  Pt to increase LEFs score to at least 49/80 to improve self-confidence of functional abilities with daily activities.  Baseline: 31/80 Goal status: INITIAL  3.  Pt to improve gait mechanics without significant valgus of right knee and an improved upright posture with the appropriately chosen assistive device so that he experiences greater ease with ambulating [place] distances with his wife.  Baseline: see above  Goal status: INITIAL   PLAN:  PT FREQUENCY: 2x/week  PT DURATION: 6 weeks  PLANNED INTERVENTIONS: 97164- PT Re-evaluation, 97110-Therapeutic exercises, 97530- Therapeutic activity, 97112- Neuromuscular re-education, 97535- Self Care, [id number]- Manual therapy, Patient/Family education, Balance training, Cryotherapy, and Moist heat  PLAN FOR NEXT SESSION: Continue gross LE strengthening for carryover for functional gait activities. Continue ambulatory activities with SPC. Monitor ankle orthotic usage.   Curtistine Bracket, SPT  Ozell JAYSON Sero, PT, DPT # [phone] 03/16/2024, 10:43 AM

## 2024-03-21 ENCOUNTER — Ambulatory Visit: Admitting: Physical Therapy

## 2024-03-21 DIAGNOSIS — M6281 Muscle weakness (generalized): Secondary | ICD-10-CM

## 2024-03-21 DIAGNOSIS — R2689 Other abnormalities of gait and mobility: Secondary | ICD-10-CM

## 2024-03-21 DIAGNOSIS — M1710 Unilateral primary osteoarthritis, unspecified knee: Secondary | ICD-10-CM

## 2024-03-21 DIAGNOSIS — M2170 Unequal limb length (acquired), unspecified site: Secondary | ICD-10-CM

## 2024-03-21 DIAGNOSIS — G8929 Other chronic pain: Secondary | ICD-10-CM

## 2024-03-21 DIAGNOSIS — M25561 Pain in right knee: Secondary | ICD-10-CM | POA: Diagnosis not present

## 2024-03-21 NOTE — Therapy (Signed)
 OUTPATIENT PHYSICAL THERAPY LOWER EXTREMITY TREATMENT  Patient Name: Taylor Mcbride MRN: [id number] DOB:01-12-1946, 78 y.o., male Today's Date: 03/21/2024  END OF SESSION:  PT End of Session - 03/21/24 0817     Visit Number 7    Number of Visits 12    Date for Recertification  04/03/24    PT Start Time 0816    PT Stop Time 0901    PT Time Calculation (min) 45 min    Equipment Utilized During Treatment Gait belt    Activity Tolerance Patient tolerated treatment well;No increased pain;Patient limited by fatigue    Behavior During Therapy [facility] for tasks assessed/performed          Past Medical History:  Diagnosis Date   Alcohol abuse, in remission    Hyperlipidemia    Hypertension    Spinal stenosis in cervical region    Past Surgical History:  Procedure Laterality Date   CARDIOVASCULAR STRESS TEST  2006   normal, during admission for chest pain,  [place]    COLONOSCOPY WITH PROPOFOL  N/A 05/08/2019   Procedure: COLONOSCOPY WITH PROPOFOL ;  Surgeon: Toledo, Ladell POUR, MD;  Location: [facility] ENDOSCOPY;  Service: Gastroenterology;  Laterality: N/A;   HERNIA REPAIR  1996   right inguinal hernia repair with mesh   SMALL INTESTINE SURGERY     cerivcal disk fusion   Patient Active Problem List   Diagnosis Date Noted   Hypertension 01/05/2012   Hyperlipidemia with target low density lipoprotein (LDL) cholesterol less than 100 mg/dL 91/86/7986   Arrhythmia 01/05/2012   Chronic back pain greater than 3 months duration 01/05/2012   Spinal stenosis in cervical region    Alcohol abuse, in remission     PCP: Delfina Pao, MD  REFERRING PROVIDER: Ewing, Lamar RIGGERS  REFERRING DIAG:  M17.11 (ICD-10-CM) - Unilateral primary osteoarthritis, right knee  M25.561 (ICD-10-CM) - Pain in right knee  G89.29 (ICD-10-CM) - Other chronic pain  R26.81 (ICD-10-CM) - Unsteadiness on feet    THERAPY DIAG:  Muscle weakness (generalized)  Chronic pain of right knee  Other abnormalities of  gait and mobility  Unilateral osteoarthritis of knee  Acquired leg length discrepancy  Rationale for Evaluation and Treatment: Rehabilitation  ONSET DATE: ~August 2025  SUBJECTIVE:   SUBJECTIVE STATEMENT: Pt is a 78 year old male who presents to physical therapy with concerns of chronic right knee pain and general lower extremity strength/endurance concerns affecting his functional independence. Pt has 0/10 right knee pain at rest but shooting pain can raise to a 10/10 with standing for long periods of time at church or walking for long periods of time in the community with his wife. Pt states that he feels fatigued with these activities and has noticed occasional instances of his right kne giving out. Pt reports that this is a chronic issue that has started irritating him more within the past two months. Pain is alleviated with rest and 1 week usage of right knee brace may be helping too, I'm not sure. Pt reports an allergy to NSAIDs so he is unable to take them for pain management at this time.   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 Pt reports having 4 metal rods placed in his right ankle which is why he has been wearing a brace over his right ankle for years. Pt has not used orthotic in right sneaker but does report that his left leg is longer than my right. Pt ambulating with SPC in clinic on  this day but states that this is new for him and that he has been walking at home without a device for the most part. Pt reports being independent at home and participates in yoga twice a week for general stretching/core strengthening.  PAIN:  Are you having pain? Yes: NPRS scale: 0/10 current, 10/10 at worst Pain location: General right knee pain Pain description: shooting Aggravating factors: Prolonged standing, prolonged walking, pivoting/turning at home, walking on outdoor surfaces Relieving factors: Rest  PRECAUTIONS: None  RED  FLAGS: None   WEIGHT BEARING RESTRICTIONS: No  FALLS:  Has patient fallen in last 6 months? Yes. Number of falls 1 (outdoors due to multitasking, assisted by family to get back up)  LIVING ENVIRONMENT: Lives with: lives with their spouse Lives in: House/apartment Stairs: No Has following equipment at home: Single point cane  OCCUPATION: Retired  PLOF: Independent  PATIENT GOALS: To decrease shooting pain in right knee and improve lower extremity strength so that he may return to outdoor walking with his wife and dog with greater ease  NEXT MD VISIT: None scheduled currently  OBJECTIVE:  Note: Objective measures were completed at Evaluation unless otherwise noted.  DIAGNOSTIC FINDINGS: N/A.  See care everywhere for [facility] records.  PATIENT SURVEYS:  LEFS: 31/80  COGNITION: Overall cognitive status: Within functional limits for tasks assessed     SENSATION: Light touch: WFL  MUSCLE LENGTH: Hamstrings: Right 75 deg; Left 65 deg   POSTURE: rounded shoulders, forward head, increased thoracic kyphosis, flexed trunk , and weight shift left  PALPATION: No significant tenderness or pain to touch  LOWER EXTREMITY ROM:  Active ROM Right eval Left eval  Hip flexion 94 deg 92 deg  Hip extension    Hip abduction    Hip adduction    Hip internal rotation 18 deg 14 deg  Hip external rotation 24 deg 29 deg  Knee flexion 121 deg 119 deg  Knee extension -5 deg -1 deg  Ankle dorsiflexion    Ankle plantarflexion    Ankle inversion    Ankle eversion     (Blank rows = not tested)  LOWER EXTREMITY MMT:  MMT Right eval Left eval  Hip flexion -4 -4  Hip extension    Hip abduction 4- 4-  Hip adduction 4- 4  Hip internal rotation 4- 4-  Hip external rotation 4- 4-  Knee flexion 4- 4-  Knee extension 4- 4+  Ankle dorsiflexion NT 4+  Ankle plantarflexion    Ankle inversion    Ankle eversion     (Blank rows = not tested)  LOWER EXTREMITY SPECIAL TESTS:  FADDIR:  Negative SLR: Negative   FUNCTIONAL TESTS:  5xSTS: 24.47 seconds (with bilateral UE support from chair arms)  GAIT: Distance walked: 40 ft around clinic Assistive device utilized: Single point cane and None Level of assistance: Modified independence Comments: Pt ambulated with SPC in RUE for support and was observed to have wide BOS with SPC and lateral trunk lean to left side. Pt ambulated with bilateral shortened step length, forward head posture and rounded shoulders, and decreased cadence. Pt ambulated with a step to gait pattern and decreased hip extension noted during toe off phase. Pt with significant right knee valgus noted during gait and during static stance.    Leg Length from umbilicus: symmetrical bilaterally Right leg length from ASIS: 103 cm (decreased right knee flexion and knee valgus present) Left leg length from ASIS: 100 cm  BERG: 43/50  FGA 10/30 (without use of  an assistive device) mCTSIB: significant sway with EO and EC on foam pad  03/16/2024 MMT Right eval Left eval  Hip flexion 4 4  Hip extension    Hip abduction 4- 4-  Hip adduction 4 4                                                                                                                                TREATMENT DATE: 03/21/2024    Subjective: Pt reports 3-4/10 right knee pain at start of treatment session. Pt presents to physical therapy with SPC usage in RUE, right knee brace and wrap around right ankle. Pt reports falling out of his bed this morning when trying to maneuver his way around his dog. Pt denies hitting his head or experiencing any other injuries and states that he was able to get back up by himself.  Pts. MD order for AFO evaluation with Orthotist/Prosthetics was given to pts. Wife and faxed to [facility] in [place].    Therapeutic Exercise:   Standing hip abduction and extension with visual feedback from mirror and bilateral UE support from // bars, 2 x 12 bilaterally  each  Therapeutic Activity:  Nustep L2-3 B UE/LE (seat position 8): 8 minutes with no rest breaks.  Walking in // bars with forward and lateral step over 6 inch hurdles, 4 x down and back  Standing static balance on foam pad, eyes open and closed, narrow BOS, 4 sets to failure  Forward and backwards walking in // bars with use of SPC in RUE, 3 x down and back   BOSU ball static balance, A/P and M/L weight shifting, 1-2 minutes each  Not performed on this day:  Seated/ standing hamstring stretches (cuing for proper technique/ static holds).   Seated Marches and LAQs, 20x each Step ups to 6-inch stair, 2 x 12  Walking over uneven surfaces outdoors (grass, gravel rocks), ~70 ft total Standing on foam pad with ball toss, 2 x 40 seconds Standing on firm surface and foam pad with random Blaze pod tapping overhead, 3 x 45 seconds (requires LUE for balance support on HR despite verbal cues)  Sit to stands from gray chair with UE assist (focus on symmetrical WB through bilateral LEs), 1 x 10  Lateral resisted walking in // bars, 2 Black Tbs, 3 x down and back   PATIENT EDUCATION:  Education details: Anatomy involved, prognosis, POC, HEP, importance of continued participation in community activities such as walking and yoga Person educated: Patient Education method: Explanation, Demonstration, Tactile cues, Verbal cues, and Handouts Education comprehension: verbalized understanding and returned demonstration  HOME EXERCISE PROGRAM: Access Code: [email or web address] URL: [email or web address] Date: 02/21/2024 Prepared by: Ozell Sero Exercises - Standing Hip Extension with Counter Support - 1 x daily - 7 x weekly - 2 sets - 12 reps - Standing Hip Abduction with Counter Support - 1 x daily - 7 x weekly - 2 sets - 12 reps -  Standing Hamstring Stretch with Step - 1 x daily - 7 x weekly - 3 sets - 10 reps - 20 sec. hold - Supine March - 1 x daily - 7 x weekly - 2 sets - 10 reps    ASSESSMENT:  CLINICAL IMPRESSION: Pt BOSU balance and M/L and A/P weightshifting to challenge static balance. Pt continues to shift weight/lose balance posteriorly with static standing tasks on BOSU ball and Airexpad secondary to postural deficits of lumbar and cervical spine and bilateral hip flexor shortening that increase with fatigue. Initiated bilateral hip extension exercise and backwards walking in // bars to address this deficit and improve capacity for patient to extend hips which he tolerated well. Pt reported no significant increase in right knee pain at end of tx session. Will continue to monitor and progress as able.    OBJECTIVE IMPAIRMENTS: Abnormal gait, decreased activity tolerance, decreased balance, decreased endurance, decreased knowledge of use of DME, difficulty walking, decreased ROM, decreased strength, postural dysfunction, and pain.   ACTIVITY LIMITATIONS: carrying, lifting, standing, and transfers  PARTICIPATION LIMITATIONS: cleaning, laundry, and community activity  PERSONAL FACTORS: Age and 1-2 comorbidities: hypertension and chronic low back pain are also affecting patient's functional outcome.   REHAB POTENTIAL: Good  CLINICAL DECISION MAKING: Evolving/moderate complexity  EVALUATION COMPLEXITY: High   GOALS: Goals reviewed with patient? Yes  SHORT TERM GOALS: Target date: 03/13/2024 Pt to increase gross bilateral hip strength to at least a 4/5 in order to improve ability to stand for prolonged periods of time which is required for attending church services.  Baseline:see above. 10/23: 4/5 bilaterally and 4-/5 bilateral hip abduction Goal status: On-going  2.  Pt to experience no greater than 6/10 right knee pain at it's worst to improve ability to complete household tasks with greater ease.  Baseline: 10/10 at worst. 10/23: 6/10 Goal status: GOAL MET  LONG TERM GOALS: Target date: 04/03/2024  Pt to increase 5xSTS score to at least 15 seconds  without the use of bilateral upper extremity support a to improve independence with functional transfers and decrease fall risk.  Baseline: 24.47 seconds with bilateral UE usage Goal status: INITIAL  2.  Pt to increase LEFs score to at least 49/80 to improve self-confidence of functional abilities with daily activities.  Baseline: 31/80 Goal status: INITIAL  3.  Pt to improve gait mechanics without significant valgus of right knee and an improved upright posture with the appropriately chosen assistive device so that he experiences greater ease with ambulating [place] distances with his wife.  Baseline: see above  Goal status: INITIAL   PLAN:  PT FREQUENCY: 2x/week  PT DURATION: 6 weeks  PLANNED INTERVENTIONS: 97164- PT Re-evaluation, 97110-Therapeutic exercises, 97530- Therapeutic activity, 97112- Neuromuscular re-education, 97535- Self Care, [id number]- Manual therapy, Patient/Family education, Balance training, Cryotherapy, and Moist heat  PLAN FOR NEXT SESSION: Continue gross LE strengthening for carryover for functional gait activities. Continue ambulatory activities with SPC.   Curtistine Bracket, SPT  Ozell JAYSON Sero, PT, DPT # [phone] 03/21/2024, 8:17 AM

## 2024-03-23 ENCOUNTER — Ambulatory Visit: Admitting: Physical Therapy

## 2024-03-23 ENCOUNTER — Encounter: Payer: Self-pay | Admitting: Physical Therapy

## 2024-03-23 DIAGNOSIS — M2170 Unequal limb length (acquired), unspecified site: Secondary | ICD-10-CM

## 2024-03-23 DIAGNOSIS — G8929 Other chronic pain: Secondary | ICD-10-CM

## 2024-03-23 DIAGNOSIS — M1710 Unilateral primary osteoarthritis, unspecified knee: Secondary | ICD-10-CM

## 2024-03-23 DIAGNOSIS — M6281 Muscle weakness (generalized): Secondary | ICD-10-CM

## 2024-03-23 DIAGNOSIS — R2689 Other abnormalities of gait and mobility: Secondary | ICD-10-CM

## 2024-03-23 DIAGNOSIS — M25561 Pain in right knee: Secondary | ICD-10-CM | POA: Diagnosis not present

## 2024-03-23 NOTE — Therapy (Signed)
 OUTPATIENT PHYSICAL THERAPY LOWER EXTREMITY TREATMENT  Patient Name: Taylor Mcbride MRN: [id number] DOB:Oct 20, 1945, 78 y.o., male Today's Date: 03/23/2024  END OF SESSION:  PT End of Session - 03/23/24 0947     Visit Number 8    Number of Visits 12    Date for Recertification  04/03/24    PT Start Time 0947    PT Stop Time 1032    PT Time Calculation (min) 45 min    Equipment Utilized During Treatment Gait belt    Activity Tolerance Patient tolerated treatment well;Patient limited by fatigue;No increased pain    Behavior During Therapy [facility] for tasks assessed/performed         Past Medical History:  Diagnosis Date   Alcohol abuse, in remission    Hyperlipidemia    Hypertension    Spinal stenosis in cervical region    Past Surgical History:  Procedure Laterality Date   CARDIOVASCULAR STRESS TEST  2006   normal, during admission for chest pain,  [place]    COLONOSCOPY WITH PROPOFOL  N/A 05/08/2019   Procedure: COLONOSCOPY WITH PROPOFOL ;  Surgeon: Toledo, Ladell POUR, MD;  Location: [facility] ENDOSCOPY;  Service: Gastroenterology;  Laterality: N/A;   HERNIA REPAIR  1996   right inguinal hernia repair with mesh   SMALL INTESTINE SURGERY     cerivcal disk fusion   Patient Active Problem List   Diagnosis Date Noted   Hypertension 01/05/2012   Hyperlipidemia with target low density lipoprotein (LDL) cholesterol less than 100 mg/dL 91/86/7986   Arrhythmia 01/05/2012   Chronic back pain greater than 3 months duration 01/05/2012   Spinal stenosis in cervical region    Alcohol abuse, in remission     PCP: Delfina Pao, MD  REFERRING PROVIDER: Ewing, Lamar RIGGERS  REFERRING DIAG:  M17.11 (ICD-10-CM) - Unilateral primary osteoarthritis, right knee  M25.561 (ICD-10-CM) - Pain in right knee  G89.29 (ICD-10-CM) - Other chronic pain  R26.81 (ICD-10-CM) - Unsteadiness on feet    THERAPY DIAG:  Muscle weakness (generalized)  Chronic pain of right knee  Other abnormalities of  gait and mobility  Unilateral osteoarthritis of knee  Acquired leg length discrepancy  Rationale for Evaluation and Treatment: Rehabilitation  ONSET DATE: ~August 2025  SUBJECTIVE:   SUBJECTIVE STATEMENT: Pt is a 78 year old male who presents to physical therapy with concerns of chronic right knee pain and general lower extremity strength/endurance concerns affecting his functional independence. Pt has 0/10 right knee pain at rest but shooting pain can raise to a 10/10 with standing for long periods of time at church or walking for long periods of time in the community with his wife. Pt states that he feels fatigued with these activities and has noticed occasional instances of his right kne giving out. Pt reports that this is a chronic issue that has started irritating him more within the past two months. Pain is alleviated with rest and 1 week usage of right knee brace may be helping too, I'm not sure. Pt reports an allergy to NSAIDs so he is unable to take them for pain management at this time.   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 Pt reports having 4 metal rods placed in his right ankle which is why he has been wearing a brace over his right ankle for years. Pt has not used orthotic in right sneaker but does report that his left leg is longer than my right. Pt ambulating with SPC in clinic on this  day but states that this is new for him and that he has been walking at home without a device for the most part. Pt reports being independent at home and participates in yoga twice a week for general stretching/core strengthening.  PAIN:  Are you having pain? Yes: NPRS scale: 0/10 current, 10/10 at worst Pain location: General right knee pain Pain description: shooting Aggravating factors: Prolonged standing, prolonged walking, pivoting/turning at home, walking on outdoor surfaces Relieving factors: Rest  PRECAUTIONS: None  RED  FLAGS: None   WEIGHT BEARING RESTRICTIONS: No  FALLS:  Has patient fallen in last 6 months? Yes. Number of falls 1 (outdoors due to multitasking, assisted by family to get back up)  LIVING ENVIRONMENT: Lives with: lives with their spouse Lives in: House/apartment Stairs: No Has following equipment at home: Single point cane  OCCUPATION: Retired  PLOF: Independent  PATIENT GOALS: To decrease shooting pain in right knee and improve lower extremity strength so that he may return to outdoor walking with his wife and dog with greater ease  NEXT MD VISIT: None scheduled currently  OBJECTIVE:  Note: Objective measures were completed at Evaluation unless otherwise noted.  DIAGNOSTIC FINDINGS: N/A.  See care everywhere for [facility] records.  PATIENT SURVEYS:  LEFS: 31/80  COGNITION: Overall cognitive status: Within functional limits for tasks assessed     SENSATION: Light touch: WFL  MUSCLE LENGTH: Hamstrings: Right 75 deg; Left 65 deg   POSTURE: rounded shoulders, forward head, increased thoracic kyphosis, flexed trunk , and weight shift left  PALPATION: No significant tenderness or pain to touch  LOWER EXTREMITY ROM:  Active ROM Right eval Left eval  Hip flexion 94 deg 92 deg  Hip extension    Hip abduction    Hip adduction    Hip internal rotation 18 deg 14 deg  Hip external rotation 24 deg 29 deg  Knee flexion 121 deg 119 deg  Knee extension -5 deg -1 deg  Ankle dorsiflexion    Ankle plantarflexion    Ankle inversion    Ankle eversion     (Blank rows = not tested)  LOWER EXTREMITY MMT:  MMT Right eval Left eval  Hip flexion -4 -4  Hip extension    Hip abduction 4- 4-  Hip adduction 4- 4  Hip internal rotation 4- 4-  Hip external rotation 4- 4-  Knee flexion 4- 4-  Knee extension 4- 4+  Ankle dorsiflexion NT 4+  Ankle plantarflexion    Ankle inversion    Ankle eversion     (Blank rows = not tested)  LOWER EXTREMITY SPECIAL TESTS:  FADDIR:  Negative SLR: Negative   FUNCTIONAL TESTS:  5xSTS: 24.47 seconds (with bilateral UE support from chair arms)  GAIT: Distance walked: 40 ft around clinic Assistive device utilized: Single point cane and None Level of assistance: Modified independence Comments: Pt ambulated with SPC in RUE for support and was observed to have wide BOS with SPC and lateral trunk lean to left side. Pt ambulated with bilateral shortened step length, forward head posture and rounded shoulders, and decreased cadence. Pt ambulated with a step to gait pattern and decreased hip extension noted during toe off phase. Pt with significant right knee valgus noted during gait and during static stance.    Leg Length from umbilicus: symmetrical bilaterally Right leg length from ASIS: 103 cm (decreased right knee flexion and knee valgus present) Left leg length from ASIS: 100 cm  BERG: 43/50  FGA 10/30 (without use of an  assistive device) mCTSIB: significant sway with EO and EC on foam pad  03/16/2024 MMT Right eval Left eval  Hip flexion 4 4  Hip extension    Hip abduction 4- 4-  Hip adduction 4 4                                                                                                                                TREATMENT DATE: 03/23/2024    Subjective: Pt reports ~4/10 right knee tenderness and sore kind of pain at start of treatment session which he noticed in the car on the way to physical therapy. Pt presents to physical therapy with SPC usage in RUE, right knee brace and cloth wrap around right ankle.  Pts. MD order for AFO evaluation with Orthotist/Prosthetics was given to pts. wife and faxed to [facility] in [place], he has an appointment scheduled with them next week.   Therapeutic Activity:  Nustep L3 B UE/LE (seat position 8): 8 minutes with no rest breaks  Walking in // bars with forward and lateral step over 6 inch hurdles, and anterior cone taps with bilateral LE to challenge SLS, 4 x down  and back  Forward walking in hallway over uneven blue mat surface with varying AWs placed underneath and use of SPC in RUE, 3 x down and back   Forward walking in hallway with use of SPC in RUE, ~70 feet total  Lateral walking on foam pad in // bars, 2 x down and back  Static standing with narrow BOS in // bars with 2# medicine ball push ups and outs, 2 x 30 seconds  Not performed on this day:  Seated/ standing hamstring stretches (cuing for proper technique/ static holds).   Seated Marches and LAQs, 20x each Step ups to 6-inch stair, 2 x 12  Walking over uneven surfaces outdoors (grass, gravel rocks), ~70 ft total Standing on foam pad with ball toss, 2 x 40 seconds Standing on firm surface and foam pad with random Blaze pod tapping overhead, 3 x 45 seconds (requires LUE for balance support on HR despite verbal cues)  Sit to stands from gray chair with UE assist (focus on symmetrical WB through bilateral LEs), 1 x 10  Lateral resisted walking in // bars, 2 Black Tbs, 3 x down and back  BOSU ball static balance, A/P and M/L weight shifting, 1-2 minutes each Standing static balance on foam pad, eyes open and closed, narrow BOS, 4 sets to failure Therapeutic Exercise:  Standing hip abduction and extension with visual feedback from mirror and bilateral UE support from // bars, 2 x 12 bilaterally each  PATIENT EDUCATION:  Education details: Anatomy involved, prognosis, POC, HEP, importance of continued participation in community activities such as walking and yoga Person educated: Patient Education method: Explanation, Demonstration, Tactile cues, Verbal cues, and Handouts Education comprehension: verbalized understanding and returned demonstration  HOME EXERCISE PROGRAM: Access Code: [email or web address] URL: [email or web address] Date: 02/21/2024 Prepared  by: Ozell Sero Exercises - Standing Hip Extension with Counter Support - 1 x daily - 7 x weekly - 2 sets - 12 reps - Standing Hip  Abduction with Counter Support - 1 x daily - 7 x weekly - 2 sets - 12 reps - Standing Hamstring Stretch with Step - 1 x daily - 7 x weekly - 3 sets - 10 reps - 20 sec. hold - Supine March - 1 x daily - 7 x weekly - 2 sets - 10 reps   ASSESSMENT:  CLINICAL IMPRESSION: Pt was introduced to forward walking in hallway over uneven blue mat surface with varying AWs placed underneath and use of SPC in RUE which he tolerated well and with minor LOB noted anteriorly but patient able to maintain upright balance with use of stepping strategy of LLE. Pt was also introduced to lateral walking on foam pad in // bars and static standing with narrow BOS in // bars with 2# medicine ball push ups and outs with CGA from therapist required and no LOB noted but pt did fatigue upon completion of each activity. Pt did report systemic fatigue at end of tx session from new activities introduced on this day but no significant increase in right knee pain. Will continue to monitor and progress as able.    OBJECTIVE IMPAIRMENTS: Abnormal gait, decreased activity tolerance, decreased balance, decreased endurance, decreased knowledge of use of DME, difficulty walking, decreased ROM, decreased strength, postural dysfunction, and pain.   ACTIVITY LIMITATIONS: carrying, lifting, standing, and transfers  PARTICIPATION LIMITATIONS: cleaning, laundry, and community activity  PERSONAL FACTORS: Age and 1-2 comorbidities: hypertension and chronic low back pain are also affecting patient's functional outcome.   REHAB POTENTIAL: Good  CLINICAL DECISION MAKING: Evolving/moderate complexity  EVALUATION COMPLEXITY: High   GOALS: Goals reviewed with patient? Yes  SHORT TERM GOALS: Target date: 03/13/2024 Pt to increase gross bilateral hip strength to at least a 4/5 in order to improve ability to stand for prolonged periods of time which is required for attending church services.  Baseline:see above. 10/23: 4/5 bilaterally and 4-/5  bilateral hip abduction Goal status: On-going  2.  Pt to experience no greater than 6/10 right knee pain at it's worst to improve ability to complete household tasks with greater ease.  Baseline: 10/10 at worst. 10/23: 6/10 Goal status: GOAL MET  LONG TERM GOALS: Target date: 04/03/2024  Pt to increase 5xSTS score to at least 15 seconds without the use of bilateral upper extremity support a to improve independence with functional transfers and decrease fall risk.  Baseline: 24.47 seconds with bilateral UE usage Goal status: INITIAL  2.  Pt to increase LEFs score to at least 49/80 to improve self-confidence of functional abilities with daily activities.  Baseline: 31/80 Goal status: INITIAL  3.  Pt to improve gait mechanics without significant valgus of right knee and an improved upright posture with the appropriately chosen assistive device so that he experiences greater ease with ambulating [place] distances with his wife.  Baseline: see above  Goal status: INITIAL   PLAN:  PT FREQUENCY: 2x/week  PT DURATION: 6 weeks  PLANNED INTERVENTIONS: 97164- PT Re-evaluation, 97110-Therapeutic exercises, 97530- Therapeutic activity, 97112- Neuromuscular re-education, 97535- Self Care, [id number]- Manual therapy, Patient/Family education, Balance training, Cryotherapy, and Moist heat  PLAN FOR NEXT SESSION: Continue gross LE strengthening for carryover for functional gait activities. Continue ambulatory activities with SPC.   Curtistine Bracket, SPT  Ozell JAYSON Sero, PT, DPT # [phone] 03/23/2024, 11:48 AM

## 2024-03-29 ENCOUNTER — Ambulatory Visit: Attending: Physician Assistant | Admitting: Physical Therapy

## 2024-03-29 DIAGNOSIS — M2170 Unequal limb length (acquired), unspecified site: Secondary | ICD-10-CM | POA: Insufficient documentation

## 2024-03-29 DIAGNOSIS — M25561 Pain in right knee: Secondary | ICD-10-CM | POA: Diagnosis present

## 2024-03-29 DIAGNOSIS — M1710 Unilateral primary osteoarthritis, unspecified knee: Secondary | ICD-10-CM | POA: Insufficient documentation

## 2024-03-29 DIAGNOSIS — G8929 Other chronic pain: Secondary | ICD-10-CM | POA: Insufficient documentation

## 2024-03-29 DIAGNOSIS — R2689 Other abnormalities of gait and mobility: Secondary | ICD-10-CM | POA: Insufficient documentation

## 2024-03-29 DIAGNOSIS — M6281 Muscle weakness (generalized): Secondary | ICD-10-CM | POA: Diagnosis present

## 2024-03-29 NOTE — Therapy (Signed)
 OUTPATIENT PHYSICAL THERAPY LOWER EXTREMITY TREATMENT  Patient Name: Taylor Mcbride MRN: [id number] DOB:[birth year], 78 y.o., male Today's Date: 03/29/2024  END OF SESSION:  PT End of Session - 03/29/24 1027     Visit Number 9    Number of Visits 12    Date for Recertification  04/03/24    PT Start Time 1028    PT Stop Time 1113    PT Time Calculation (min) 45 min    Equipment Utilized During Treatment Gait belt    Activity Tolerance Patient tolerated treatment well;Patient limited by fatigue;No increased pain    Behavior During Therapy [facility] for tasks assessed/performed          Past Medical History:  Diagnosis Date   Alcohol abuse, in remission    Hyperlipidemia    Hypertension    Spinal stenosis in cervical region    Past Surgical History:  Procedure Laterality Date   CARDIOVASCULAR STRESS TEST  2006   normal, during admission for chest pain,  [place]    COLONOSCOPY WITH PROPOFOL  N/A 05/08/2019   Procedure: COLONOSCOPY WITH PROPOFOL ;  Surgeon: Toledo, Ladell POUR, MD;  Location: [facility] ENDOSCOPY;  Service: Gastroenterology;  Laterality: N/A;   HERNIA REPAIR  1996   right inguinal hernia repair with mesh   SMALL INTESTINE SURGERY     cerivcal disk fusion   Patient Active Problem List   Diagnosis Date Noted   Hypertension 01/05/2012   Hyperlipidemia with target low density lipoprotein (LDL) cholesterol less than 100 mg/dL 91/86/7986   Arrhythmia 01/05/2012   Chronic back pain greater than 3 months duration 01/05/2012   Spinal stenosis in cervical region    Alcohol abuse, in remission     PCP: Delfina Pao, MD  REFERRING PROVIDER: Ewing, Lamar RIGGERS  REFERRING DIAG:  M17.11 (ICD-10-CM) - Unilateral primary osteoarthritis, right knee  M25.561 (ICD-10-CM) - Pain in right knee  G89.29 (ICD-10-CM) - Other chronic pain  R26.81 (ICD-10-CM) - Unsteadiness on feet    THERAPY DIAG:  Muscle weakness (generalized)  Chronic pain of right knee  Other abnormalities of  gait and mobility  Unilateral osteoarthritis of knee  Acquired leg length discrepancy  Rationale for Evaluation and Treatment: Rehabilitation  ONSET DATE: ~August 2025  SUBJECTIVE:   SUBJECTIVE STATEMENT: Pt is a 78 year old male who presents to physical therapy with concerns of chronic right knee pain and general lower extremity strength/endurance concerns affecting his functional independence. Pt has 0/10 right knee pain at rest but shooting pain can raise to a 10/10 with standing for long periods of time at church or walking for long periods of time in the community with his wife. Pt states that he feels fatigued with these activities and has noticed occasional instances of his right kne giving out. Pt reports that this is a chronic issue that has started irritating him more within the past two months. Pain is alleviated with rest and 1 week usage of right knee brace may be helping too, I'm not sure. Pt reports an allergy to NSAIDs so he is unable to take them for pain management at this time.   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 Pt reports having 4 metal rods placed in his right ankle which is why he has been wearing a brace over his right ankle for years. Pt has not used orthotic in right sneaker but does report that his left leg is longer than my right. Pt ambulating with SPC in clinic on  this day but states that this is new for him and that he has been walking at home without a device for the most part. Pt reports being independent at home and participates in yoga twice a week for general stretching/core strengthening.  PAIN:  Are you having pain? Yes: NPRS scale: 0/10 current, 10/10 at worst Pain location: General right knee pain Pain description: shooting Aggravating factors: Prolonged standing, prolonged walking, pivoting/turning at home, walking on outdoor surfaces Relieving factors: Rest  PRECAUTIONS: None  RED  FLAGS: None   WEIGHT BEARING RESTRICTIONS: No  FALLS:  Has patient fallen in last 6 months? Yes. Number of falls 1 (outdoors due to multitasking, assisted by family to get back up)  LIVING ENVIRONMENT: Lives with: lives with their spouse Lives in: House/apartment Stairs: No Has following equipment at home: Single point cane  OCCUPATION: Retired  PLOF: Independent  PATIENT GOALS: To decrease shooting pain in right knee and improve lower extremity strength so that he may return to outdoor walking with his wife and dog with greater ease  NEXT MD VISIT: None scheduled currently  OBJECTIVE:  Note: Objective measures were completed at Evaluation unless otherwise noted.  DIAGNOSTIC FINDINGS: N/A.  See care everywhere for [facility] records.  PATIENT SURVEYS:  LEFS: 31/80  COGNITION: Overall cognitive status: Within functional limits for tasks assessed     SENSATION: Light touch: WFL  MUSCLE LENGTH: Hamstrings: Right 75 deg; Left 65 deg   POSTURE: rounded shoulders, forward head, increased thoracic kyphosis, flexed trunk , and weight shift left  PALPATION: No significant tenderness or pain to touch  LOWER EXTREMITY ROM:  Active ROM Right eval Left eval  Hip flexion 94 deg 92 deg  Hip extension    Hip abduction    Hip adduction    Hip internal rotation 18 deg 14 deg  Hip external rotation 24 deg 29 deg  Knee flexion 121 deg 119 deg  Knee extension -5 deg -1 deg  Ankle dorsiflexion    Ankle plantarflexion    Ankle inversion    Ankle eversion     (Blank rows = not tested)  LOWER EXTREMITY MMT:  MMT Right eval Left eval  Hip flexion -4 -4  Hip extension    Hip abduction 4- 4-  Hip adduction 4- 4  Hip internal rotation 4- 4-  Hip external rotation 4- 4-  Knee flexion 4- 4-  Knee extension 4- 4+  Ankle dorsiflexion NT 4+  Ankle plantarflexion    Ankle inversion    Ankle eversion     (Blank rows = not tested)  LOWER EXTREMITY SPECIAL TESTS:  FADDIR:  Negative SLR: Negative   FUNCTIONAL TESTS:  5xSTS: 24.47 seconds (with bilateral UE support from chair arms)  GAIT: Distance walked: 40 ft around clinic Assistive device utilized: Single point cane and None Level of assistance: Modified independence Comments: Pt ambulated with SPC in RUE for support and was observed to have wide BOS with SPC and lateral trunk lean to left side. Pt ambulated with bilateral shortened step length, forward head posture and rounded shoulders, and decreased cadence. Pt ambulated with a step to gait pattern and decreased hip extension noted during toe off phase. Pt with significant right knee valgus noted during gait and during static stance.    Leg Length from umbilicus: symmetrical bilaterally Right leg length from ASIS: 103 cm (decreased right knee flexion and knee valgus present) Left leg length from ASIS: 100 cm  BERG: 43/50  FGA 10/30 (without use of  an assistive device) mCTSIB: significant sway with EO and EC on foam pad  03/16/2024 MMT Right eval Left eval  Hip flexion 4 4  Hip extension    Hip abduction 4- 4-  Hip adduction 4 4                                                                                                                                TREATMENT DATE: 03/29/2024    Subjective: Pt reports mild right knee tenderness and soreness at start of treatment session which is becoming more constant.  Pt presents to physical therapy with SPC usage in RUE, right knee brace and cloth wrap around right ankle. Pt has an AFO evaluation with Orthotist/Prosthetics at [facility] in [place] later this week.    Therapeutic Activity:  Nustep L3 B UE/LE (seat position 8): 8 minutes with no rest breaks  Forward walking outdoors over uneven grass and gravel surfaces with use of SPC in RUE, 100 ~ ft total  Forward walking in hallway with use of SPC in RUE, ~70 feet total  Forward, backwards, and lateral walking on foam pad in // bars, 3 x  down and back each and bilaterally  Static standing with narrow BOS in // bars with 4# medicine ball push ups and outs, 2 x 30 seconds  Sit to stands from gray chair height with blue Airex pad (without use of bilateral UEs), 2 x 10  Not performed on this day:  Seated/ standing hamstring stretches (cuing for proper technique/ static holds).   Seated Marches and LAQs, 20x each Step ups to 6-inch stair, 2 x 12  Walking over uneven surfaces outdoors (grass, gravel rocks), ~70 ft total Standing on foam pad with ball toss, 2 x 40 seconds Standing on firm surface and foam pad with random Blaze pod tapping overhead, 3 x 45 seconds (requires LUE for balance support on HR despite verbal cues)  Sit to stands from gray chair with UE assist (focus on symmetrical WB through bilateral LEs), 1 x 10  Lateral resisted walking in // bars, 2 Black Tbs, 3 x down and back  BOSU ball static balance, A/P and M/L weight shifting, 1-2 minutes each Standing static balance on foam pad, eyes open and closed, narrow BOS, 4 sets to failure Walking in // bars with forward and lateral step over 6 inch hurdles, and anterior cone taps with bilateral LE to challenge SLS, 4 x down and back Therapeutic Exercise:  Standing hip abduction and extension with visual feedback from mirror and bilateral UE support from // bars, 2 x 12 bilaterally each  PATIENT EDUCATION:  Education details: Anatomy involved, prognosis, POC, HEP, importance of continued participation in community activities such as walking and yoga Person educated: Patient Education method: Explanation, Demonstration, Tactile cues, Verbal cues, and Handouts Education comprehension: verbalized understanding and returned demonstration  HOME EXERCISE PROGRAM: Access Code: [email or web address] URL: [email or web address] Date: 02/21/2024 Prepared by: Ozell Sero  Exercises - Standing Hip Extension with Counter Support - 1 x daily - 7 x weekly - 2 sets - 12 reps -  Standing Hip Abduction with Counter Support - 1 x daily - 7 x weekly - 2 sets - 12 reps - Standing Hamstring Stretch with Step - 1 x daily - 7 x weekly - 3 sets - 10 reps - 20 sec. hold - Supine March - 1 x daily - 7 x weekly - 2 sets - 10 reps   ASSESSMENT:  CLINICAL IMPRESSION: Pt was progressed to completing static standing with narrow BOS in // bars with increased medicine ball resistance for push ups and outs which he tolerated well. Pt completed sit to stands from gray chair height with blue Airexpad underneath which allowed pt to stand without assistance from bilateral UEs. Pt continues to use bilateral UEs on // bars to maintain upright balance with resisted lateral walking secondary to strength and endurance deficits of bilateral hip musculature. Pt did report systemic fatigue at end of tx session from new activities introduced on this day but no significant increase in right knee pain. Will continue to monitor and progress as able.    OBJECTIVE IMPAIRMENTS: Abnormal gait, decreased activity tolerance, decreased balance, decreased endurance, decreased knowledge of use of DME, difficulty walking, decreased ROM, decreased strength, postural dysfunction, and pain.   ACTIVITY LIMITATIONS: carrying, lifting, standing, and transfers  PARTICIPATION LIMITATIONS: cleaning, laundry, and community activity  PERSONAL FACTORS: Age and 1-2 comorbidities: hypertension and chronic low back pain are also affecting patient's functional outcome.   REHAB POTENTIAL: Good  CLINICAL DECISION MAKING: Evolving/moderate complexity  EVALUATION COMPLEXITY: High   GOALS: Goals reviewed with patient? Yes  SHORT TERM GOALS: Target date: 03/13/2024 Pt to increase gross bilateral hip strength to at least a 4/5 in order to improve ability to stand for prolonged periods of time which is required for attending church services.  Baseline:see above. 10/23: 4/5 bilaterally and 4-/5 bilateral hip abduction Goal status:  On-going  2.  Pt to experience no greater than 6/10 right knee pain at it's worst to improve ability to complete household tasks with greater ease.  Baseline: 10/10 at worst. 10/23: 6/10 Goal status: GOAL MET  LONG TERM GOALS: Target date: 04/03/2024  Pt to increase 5xSTS score to at least 15 seconds without the use of bilateral upper extremity support a to improve independence with functional transfers and decrease fall risk.  Baseline: 24.47 seconds with bilateral UE usage Goal status: INITIAL  2.  Pt to increase LEFs score to at least 49/80 to improve self-confidence of functional abilities with daily activities.  Baseline: 31/80 Goal status: INITIAL  3.  Pt to improve gait mechanics without significant valgus of right knee and an improved upright posture with the appropriately chosen assistive device so that he experiences greater ease with ambulating [place] distances with his wife.  Baseline: see above  Goal status: INITIAL   PLAN:  PT FREQUENCY: 2x/week  PT DURATION: 6 weeks  PLANNED INTERVENTIONS: 97164- PT Re-evaluation, 97110-Therapeutic exercises, 97530- Therapeutic activity, 97112- Neuromuscular re-education, 97535- Self Care, [id number]- Manual therapy, Patient/Family education, Balance training, Cryotherapy, and Moist heat  PLAN FOR NEXT SESSION: Continue gross LE strengthening for carryover for functional gait activities. Continue ambulatory activities with SPC.   UPDATE GOALS  Curtistine Bracket, SPT  Ozell JAYSON Sero, PT, DPT # [phone] 03/29/2024, 10:27 AM

## 2024-04-04 ENCOUNTER — Ambulatory Visit: Admitting: Physical Therapy

## 2024-04-04 DIAGNOSIS — M6281 Muscle weakness (generalized): Secondary | ICD-10-CM

## 2024-04-04 DIAGNOSIS — M2170 Unequal limb length (acquired), unspecified site: Secondary | ICD-10-CM

## 2024-04-04 DIAGNOSIS — G8929 Other chronic pain: Secondary | ICD-10-CM

## 2024-04-04 DIAGNOSIS — R2689 Other abnormalities of gait and mobility: Secondary | ICD-10-CM

## 2024-04-04 DIAGNOSIS — M1710 Unilateral primary osteoarthritis, unspecified knee: Secondary | ICD-10-CM

## 2024-04-04 NOTE — Therapy (Unsigned)
 OUTPATIENT PHYSICAL THERAPY LOWER EXTREMITY TREATMENT/ RECERTIFICATION Physical Therapy Progress Note  Dates of reporting period  02/21/24   to   04/04/24   Patient Name: Taylor Mcbride MRN: [id number] DOB:Sep 11, 1945, 78 y.o., male Today's Date: 04/05/2024  END OF SESSION:  PT End of Session - 04/04/24 0959     Visit Number 10    Number of Visits 18    Date for Recertification  05/02/24    PT Start Time 0900    PT Stop Time 0946    PT Time Calculation (min) 46 min    Equipment Utilized During Treatment Gait belt    Activity Tolerance Patient tolerated treatment well;Patient limited by fatigue;Patient limited by pain    Behavior During Therapy [facility] for tasks assessed/performed          Past Medical History:  Diagnosis Date   Alcohol abuse, in remission    Hyperlipidemia    Hypertension    Spinal stenosis in cervical region    Past Surgical History:  Procedure Laterality Date   CARDIOVASCULAR STRESS TEST  2006   normal, during admission for chest pain,  [place]    COLONOSCOPY WITH PROPOFOL  N/A 05/08/2019   Procedure: COLONOSCOPY WITH PROPOFOL ;  Surgeon: Toledo, Ladell POUR, MD;  Location: [facility] ENDOSCOPY;  Service: Gastroenterology;  Laterality: N/A;   HERNIA REPAIR  1996   right inguinal hernia repair with mesh   SMALL INTESTINE SURGERY     cerivcal disk fusion   Patient Active Problem List   Diagnosis Date Noted   Hypertension 01/05/2012   Hyperlipidemia with target low density lipoprotein (LDL) cholesterol less than 100 mg/dL 91/86/7986   Arrhythmia 01/05/2012   Chronic back pain greater than 3 months duration 01/05/2012   Spinal stenosis in cervical region    Alcohol abuse, in remission    PCP: Delfina Pao, MD  REFERRING PROVIDER: Ewing, Lamar RIGGERS  REFERRING DIAG:  M17.11 (ICD-10-CM) - Unilateral primary osteoarthritis, right knee  M25.561 (ICD-10-CM) - Pain in right knee  G89.29 (ICD-10-CM) - Other chronic pain  R26.81 (ICD-10-CM) - Unsteadiness on feet    THERAPY DIAG:  Muscle weakness (generalized)  Chronic pain of right knee  Other abnormalities of gait and mobility  Unilateral osteoarthritis of knee  Acquired leg length discrepancy  Rationale for Evaluation and Treatment: Rehabilitation  ONSET DATE: ~August 2025  SUBJECTIVE:   SUBJECTIVE STATEMENT: Pt is a 78 year old male who presents to physical therapy with concerns of chronic right knee pain and general lower extremity strength/endurance concerns affecting his functional independence. Pt has 0/10 right knee pain at rest but shooting pain can raise to a 10/10 with standing for long periods of time at church or walking for long periods of time in the community with his wife. Pt states that he feels fatigued with these activities and has noticed occasional instances of his right kne giving out. Pt reports that this is a chronic issue that has started irritating him more within the past two months. Pain is alleviated with rest and 1 week usage of right knee brace may be helping too, I'm not sure. Pt reports an allergy to NSAIDs so he is unable to take them for pain management at this time.   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 Pt reports having 4 metal rods placed in his right ankle which is why he has been wearing a brace over his right ankle for years. Pt has not used orthotic in right sneaker but  does report that his left leg is longer than my right. Pt ambulating with SPC in clinic on this day but states that this is new for him and that he has been walking at home without a device for the most part. Pt reports being independent at home and participates in yoga twice a week for general stretching/core strengthening.  PAIN:  Are you having pain? Yes: NPRS scale: 0/10 current, 10/10 at worst Pain location: General right knee pain Pain description: shooting Aggravating factors: Prolonged standing, prolonged walking,  pivoting/turning at home, walking on outdoor surfaces Relieving factors: Rest  PRECAUTIONS: None  RED FLAGS: None   WEIGHT BEARING RESTRICTIONS: No  FALLS:  Has patient fallen in last 6 months? Yes. Number of falls 1 (outdoors due to multitasking, assisted by family to get back up)  LIVING ENVIRONMENT: Lives with: lives with their spouse Lives in: House/apartment Stairs: No Has following equipment at home: Single point cane  OCCUPATION: Retired  PLOF: Independent  PATIENT GOALS: To decrease shooting pain in right knee and improve lower extremity strength so that he may return to outdoor walking with his wife and dog with greater ease  NEXT MD VISIT: None scheduled currently  OBJECTIVE:  Note: Objective measures were completed at Evaluation unless otherwise noted.  DIAGNOSTIC FINDINGS: N/A.  See care everywhere for [facility] records.  PATIENT SURVEYS:  LEFS: 31/80  COGNITION: Overall cognitive status: Within functional limits for tasks assessed     SENSATION: Light touch: WFL  MUSCLE LENGTH: Hamstrings: Right 75 deg; Left 65 deg   POSTURE: rounded shoulders, forward head, increased thoracic kyphosis, flexed trunk , and weight shift left  PALPATION: No significant tenderness or pain to touch  LOWER EXTREMITY ROM:  Active ROM Right eval Left eval  Hip flexion 94 deg 92 deg  Hip extension    Hip abduction    Hip adduction    Hip internal rotation 18 deg 14 deg  Hip external rotation 24 deg 29 deg  Knee flexion 121 deg 119 deg  Knee extension -5 deg -1 deg  Ankle dorsiflexion    Ankle plantarflexion    Ankle inversion    Ankle eversion     (Blank rows = not tested)  LOWER EXTREMITY MMT:  MMT Right eval Left eval  Hip flexion -4 -4  Hip extension    Hip abduction 4- 4-  Hip adduction 4- 4  Hip internal rotation 4- 4-  Hip external rotation 4- 4-  Knee flexion 4- 4-  Knee extension 4- 4+  Ankle dorsiflexion NT 4+  Ankle plantarflexion    Ankle  inversion    Ankle eversion     (Blank rows = not tested)  LOWER EXTREMITY SPECIAL TESTS:  FADDIR: Negative SLR: Negative   FUNCTIONAL TESTS:  5xSTS: 24.47 seconds (with bilateral UE support from chair arms)  GAIT: Distance walked: 40 ft around clinic Assistive device utilized: Single point cane and None Level of assistance: Modified independence Comments: Pt ambulated with SPC in RUE for support and was observed to have wide BOS with SPC and lateral trunk lean to left side. Pt ambulated with bilateral shortened step length, forward head posture and rounded shoulders, and decreased cadence. Pt ambulated with a step to gait pattern and decreased hip extension noted during toe off phase. Pt with significant right knee valgus noted during gait and during static stance.    Leg Length from umbilicus: symmetrical bilaterally Right leg length from ASIS: 103 cm (decreased right knee flexion and knee  valgus present) Left leg length from ASIS: 100 cm  BERG: 43/50  FGA 10/30 (without use of an assistive device) mCTSIB: significant sway with EO and EC on foam pad  03/16/2024 MMT Right eval Left eval  Hip flexion 4 4  Hip extension    Hip abduction 4- 4-  Hip adduction 4 4                                                                                                                                TREATMENT DATE: 04/05/2024    Subjective: Pt reports mild right knee tenderness at start of treatment session but no significant pain.  Pt presents to physical therapy with SPC usage in RUE, right knee brace and cloth wrap around right ankle. Pt has an AFO evaluation with Orthotist/Prosthetics at [facility] in [place] later this week. Pt states that he took a walk yesterday with his wife over uneven, outdoor terrain and did okay with the use of SPC in RUE but he found it to be tiring which has continued on this day.   Therapeutic Activity:  Nustep L3 B UE/LE (seat position 8): 8 minutes  with no rest breaks  Forward walking in hallway with use of SPC in RUE, ~70 feet total  Forward marching in // bars, with touch assist from BUE to maintain balance when standing in SLS on RLE, 3 x down and back   Forward walking in hallway with weaving in and out of cones and stepping onto Airexpads with use of SPC in RUE, ~ 70 ft total   Forward, backwards, and lateral walking on foam pad in // bars, 3 x down and back each and bilaterally  Static standing on Airexpad with and without eyes closed, 2 x 30 seconds  5xSTS: 16.44 seconds with use of BUE support LEFs: 36 out of 80  Not performed on this day:  Seated/ standing hamstring stretches (cuing for proper technique/ static holds).   Seated Marches and LAQs, 20x each Step ups to 6-inch stair, 2 x 12  Walking over uneven surfaces outdoors (grass, gravel rocks), ~70 ft total Standing on foam pad with ball toss, 2 x 40 seconds Standing on firm surface and foam pad with random Blaze pod tapping overhead, 3 x 45 seconds (requires LUE for balance support on HR despite verbal cues)  Sit to stands from gray chair with UE assist (focus on symmetrical WB through bilateral LEs), 1 x 10  Lateral resisted walking in // bars, 2 Black Tbs, 3 x down and back  BOSU ball static balance, A/P and M/L weight shifting, 1-2 minutes each Standing static balance on foam pad, eyes open and closed, narrow BOS, 4 sets to failure Walking in // bars with forward and lateral step over 6 inch hurdles, and anterior cone taps with bilateral LE to challenge SLS, 4 x down and back Sit to stands from gray chair height with blue Airex pad (  without use of bilateral UEs), 2 x 10  Forward walking outdoors over uneven grass and gravel surfaces with use of SPC in RUE, 100 ~ ft total Therapeutic Exercise:  Standing hip abduction and extension with visual feedback from mirror and bilateral UE support from // bars, 2 x 12 bilaterally each  PATIENT EDUCATION:  Education  details: Anatomy involved, prognosis, POC, HEP, importance of continued participation in community activities such as walking and yoga Person educated: Patient Education method: Explanation, Demonstration, Tactile cues, Verbal cues, and Handouts Education comprehension: verbalized understanding and returned demonstration  HOME EXERCISE PROGRAM: Access Code: [email or web address] URL: [email or web address] Date: 02/21/2024 Prepared by: Ozell Sero Exercises - Standing Hip Extension with Counter Support - 1 x daily - 7 x weekly - 2 sets - 12 reps - Standing Hip Abduction with Counter Support - 1 x daily - 7 x weekly - 2 sets - 12 reps - Standing Hamstring Stretch with Step - 1 x daily - 7 x weekly - 3 sets - 10 reps - 20 sec. hold - Supine March - 1 x daily - 7 x weekly - 2 sets - 10 reps   ASSESSMENT:  CLINICAL IMPRESSION: Reassessed LEFs (36 out of 80) and 5xSTS (16.44 seconds) on this day. Pt observed to continue with difficulty maintaining body weight anteriorly during static balance tasks, as he has tendency to lose balance posteriorly during standing on Airex pad without use of UE assistance with and without eyes closed, requiring CGA and min A at times from therapist to maintain upright balance. Pt introduced to forward marching in // bars which he tolerated well without significant fatigue in bilateral hip flexors but was noted to have increased difficulty with SLS on RLE, requiring use of // bar at times for support. Will continue to monitor and progress as able.    Pt has attended and actively participated in 10 physical therapy visits to date. Pt has seen improvements in activity tolerance, increased endurance, and increased gross LE strength since beginning therapy. Pt is still currently limited by postural dysfunction, difficulty with ambulation, decreased balance, and right knee pain. These deficits continue to limit the patient's ability to perform household tasks such as  cleaning/cooking, complete functional transfers, or ambulate community distances without increased difficulty or right knee pain. Pt will continue to benefit from skilled physical therapy intervention to address remaining limitations listed above and to maximize functional outcomes.    OBJECTIVE IMPAIRMENTS: Abnormal gait, decreased activity tolerance, decreased balance, decreased endurance, decreased knowledge of use of DME, difficulty walking, decreased ROM, decreased strength, postural dysfunction, and pain.   ACTIVITY LIMITATIONS: carrying, lifting, standing, and transfers  PARTICIPATION LIMITATIONS: cleaning, laundry, and community activity  PERSONAL FACTORS: Age and 1-2 comorbidities: hypertension and chronic low back pain are also affecting patient's functional outcome.   REHAB POTENTIAL: Good  CLINICAL DECISION MAKING: Evolving/moderate complexity  EVALUATION COMPLEXITY: High   GOALS: Goals reviewed with patient? Yes  SHORT TERM GOALS: Target date: 04/18/2024 Pt to increase gross bilateral hip strength to at least a 4/5 in order to improve ability to stand for prolonged periods of time which is required for attending church services.  Baseline:see above. 10/23: 4/5 bilaterally and 4-/5 bilateral hip abduction Goal status: On-going  2.  Pt to experience no greater than 6/10 right knee pain at it's worst to improve ability to complete household tasks with greater ease.  Baseline: 10/10 at worst. 10/23: 6/10 Goal status: GOAL MET  LONG TERM GOALS: Target date: 05/02/2024  Pt  to increase 5xSTS score to at least 15 seconds without the use of bilateral upper extremity support a to improve independence with functional transfers and decrease fall risk.  Baseline: 24.47 seconds with bilateral UE usage, (11/11): 16.44 seconds with BUE usage Goal status: Partially met  2.  Pt to increase LEFs score to at least 49/80 to improve self-confidence of functional abilities with daily  activities.  Baseline: 31/80, (11/11): 36/80 Goal status: Partially met  3.  Pt to improve gait mechanics without significant valgus of right knee and an improved upright posture with the appropriately chosen assistive device so that he experiences greater ease with ambulating [place] distances with his wife.  Baseline: see above (11/11): significant valgus of right knee with use of SPC in RUE  Goal status: On-going  PLAN:  PT FREQUENCY: 2x/week  PT DURATION: 4 weeks  PLANNED INTERVENTIONS: 97164- PT Re-evaluation, 97110-Therapeutic exercises, 97530- Therapeutic activity, 97112- Neuromuscular re-education, 97535- Self Care, [id number]- Manual therapy, Patient/Family education, Balance training, Cryotherapy, and Moist heat  PLAN FOR NEXT SESSION: Continue gross LE strengthening for carryover for functional gait activities. Continue ambulatory activities with SPC.     Curtistine Bracket, SPT  Ozell JAYSON Sero, PT, DPT # [phone] 04/05/2024, 7:21 AM

## 2024-04-05 ENCOUNTER — Encounter: Payer: Self-pay | Admitting: Physical Therapy

## 2024-04-06 ENCOUNTER — Ambulatory Visit: Admitting: Physical Therapy

## 2024-04-06 DIAGNOSIS — M6281 Muscle weakness (generalized): Secondary | ICD-10-CM

## 2024-04-06 DIAGNOSIS — G8929 Other chronic pain: Secondary | ICD-10-CM

## 2024-04-06 DIAGNOSIS — M1710 Unilateral primary osteoarthritis, unspecified knee: Secondary | ICD-10-CM

## 2024-04-06 DIAGNOSIS — M2170 Unequal limb length (acquired), unspecified site: Secondary | ICD-10-CM

## 2024-04-06 DIAGNOSIS — R2689 Other abnormalities of gait and mobility: Secondary | ICD-10-CM

## 2024-04-06 NOTE — Therapy (Signed)
 OUTPATIENT PHYSICAL THERAPY LOWER EXTREMITY TREATMENT  Patient Name: Taylor Mcbride MRN: [id number] DOB:1945-10-04, 78 y.o., male Today's Date: 04/06/2024  END OF SESSION:  PT End of Session - 04/06/24 0904     Visit Number 11    Number of Visits 18    Date for Recertification  05/02/24    PT Start Time 0901    PT Stop Time 0949    PT Time Calculation (min) 48 min    Equipment Utilized During Treatment Gait belt    Activity Tolerance Patient tolerated treatment well;Patient limited by fatigue;Patient limited by pain    Behavior During Therapy [facility] for tasks assessed/performed          Past Medical History:  Diagnosis Date   Alcohol abuse, in remission    Hyperlipidemia    Hypertension    Spinal stenosis in cervical region    Past Surgical History:  Procedure Laterality Date   CARDIOVASCULAR STRESS TEST  2006   normal, during admission for chest pain,  [place]    COLONOSCOPY WITH PROPOFOL  N/A 05/08/2019   Procedure: COLONOSCOPY WITH PROPOFOL ;  Surgeon: Toledo, Ladell POUR, MD;  Location: [facility] ENDOSCOPY;  Service: Gastroenterology;  Laterality: N/A;   HERNIA REPAIR  1996   right inguinal hernia repair with mesh   SMALL INTESTINE SURGERY     cerivcal disk fusion   Patient Active Problem List   Diagnosis Date Noted   Hypertension 01/05/2012   Hyperlipidemia with target low density lipoprotein (LDL) cholesterol less than 100 mg/dL 91/86/7986   Arrhythmia 01/05/2012   Chronic back pain greater than 3 months duration 01/05/2012   Spinal stenosis in cervical region    Alcohol abuse, in remission    PCP: Delfina Pao, MD  REFERRING PROVIDER: Ewing, Lamar RIGGERS  REFERRING DIAG:  M17.11 (ICD-10-CM) - Unilateral primary osteoarthritis, right knee  M25.561 (ICD-10-CM) - Pain in right knee  G89.29 (ICD-10-CM) - Other chronic pain  R26.81 (ICD-10-CM) - Unsteadiness on feet   THERAPY DIAG:  Muscle weakness (generalized)  Chronic pain of right knee  Other abnormalities  of gait and mobility  Unilateral osteoarthritis of knee  Acquired leg length discrepancy  Rationale for Evaluation and Treatment: Rehabilitation  ONSET DATE: ~August 2025  SUBJECTIVE:   SUBJECTIVE STATEMENT: Pt is a 78 year old male who presents to physical therapy with concerns of chronic right knee pain and general lower extremity strength/endurance concerns affecting his functional independence. Pt has 0/10 right knee pain at rest but shooting pain can raise to a 10/10 with standing for long periods of time at church or walking for long periods of time in the community with his wife. Pt states that he feels fatigued with these activities and has noticed occasional instances of his right kne giving out. Pt reports that this is a chronic issue that has started irritating him more within the past two months. Pain is alleviated with rest and 1 week usage of right knee brace may be helping too, I'm not sure. Pt reports an allergy to NSAIDs so he is unable to take them for pain management at this time.   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 Pt reports having 4 metal rods placed in his right ankle which is why he has been wearing a brace over his right ankle for years. Pt has not used orthotic in right sneaker but does report that his left leg is longer than my right. Pt ambulating with SPC in clinic on this  day but states that this is new for him and that he has been walking at home without a device for the most part. Pt reports being independent at home and participates in yoga twice a week for general stretching/core strengthening.  PAIN:  Are you having pain? Yes: NPRS scale: 0/10 current, 10/10 at worst Pain location: General right knee pain Pain description: shooting Aggravating factors: Prolonged standing, prolonged walking, pivoting/turning at home, walking on outdoor surfaces Relieving factors: Rest  PRECAUTIONS: None  RED  FLAGS: None   WEIGHT BEARING RESTRICTIONS: No  FALLS:  Has patient fallen in last 6 months? Yes. Number of falls 1 (outdoors due to multitasking, assisted by family to get back up)  LIVING ENVIRONMENT: Lives with: lives with their spouse Lives in: House/apartment Stairs: No Has following equipment at home: Single point cane  OCCUPATION: Retired  PLOF: Independent  PATIENT GOALS: To decrease shooting pain in right knee and improve lower extremity strength so that he may return to outdoor walking with his wife and dog with greater ease  NEXT MD VISIT: None scheduled currently  OBJECTIVE:  Note: Objective measures were completed at Evaluation unless otherwise noted.  DIAGNOSTIC FINDINGS: N/A.  See care everywhere for [facility] records.  PATIENT SURVEYS:  LEFS: 31/80  COGNITION: Overall cognitive status: Within functional limits for tasks assessed     SENSATION: Light touch: WFL  MUSCLE LENGTH: Hamstrings: Right 75 deg; Left 65 deg   POSTURE: rounded shoulders, forward head, increased thoracic kyphosis, flexed trunk , and weight shift left  PALPATION: No significant tenderness or pain to touch  LOWER EXTREMITY ROM:  Active ROM Right eval Left eval  Hip flexion 94 deg 92 deg  Hip extension    Hip abduction    Hip adduction    Hip internal rotation 18 deg 14 deg  Hip external rotation 24 deg 29 deg  Knee flexion 121 deg 119 deg  Knee extension -5 deg -1 deg  Ankle dorsiflexion    Ankle plantarflexion    Ankle inversion    Ankle eversion     (Blank rows = not tested)  LOWER EXTREMITY MMT:  MMT Right eval Left eval  Hip flexion -4 -4  Hip extension    Hip abduction 4- 4-  Hip adduction 4- 4  Hip internal rotation 4- 4-  Hip external rotation 4- 4-  Knee flexion 4- 4-  Knee extension 4- 4+  Ankle dorsiflexion NT 4+  Ankle plantarflexion    Ankle inversion    Ankle eversion     (Blank rows = not tested)  LOWER EXTREMITY SPECIAL TESTS:  FADDIR:  Negative SLR: Negative   FUNCTIONAL TESTS:  5xSTS: 24.47 seconds (with bilateral UE support from chair arms)  GAIT: Distance walked: 40 ft around clinic Assistive device utilized: Single point cane and None Level of assistance: Modified independence Comments: Pt ambulated with SPC in RUE for support and was observed to have wide BOS with SPC and lateral trunk lean to left side. Pt ambulated with bilateral shortened step length, forward head posture and rounded shoulders, and decreased cadence. Pt ambulated with a step to gait pattern and decreased hip extension noted during toe off phase. Pt with significant right knee valgus noted during gait and during static stance.    Leg Length from umbilicus: symmetrical bilaterally Right leg length from ASIS: 103 cm (decreased right knee flexion and knee valgus present) Left leg length from ASIS: 100 cm  BERG: 43/50  FGA 10/30 (without use of an  assistive device) mCTSIB: significant sway with EO and EC on foam pad  03/16/2024 MMT Right eval Left eval  Hip flexion 4 4  Hip extension    Hip abduction 4- 4-  Hip adduction 4 4   5xSTS: 16.44 seconds with use of BUE support LEFs: 36 out of 80                                                                                                                               TREATMENT DATE: 04/06/2024    Subjective: Pt reports mild right knee tenderness at start of treatment session but no significant pain.  Pt presents to physical therapy with SPC usage in RUE, right knee brace and cloth wrap around right ankle. Pt had an evaluation with Orthotist/Prosthetics at [facility] in [place] for his right ankle but pt states that because of how stiff his ankle is, he is not a candidate for an orthotic. Pt was recommended to use a soft brace which he ordered off [employer] and is expected to come in later today. Pt wife reports noticing patient's posture is more upright when he is walking around the home.     Therapeutic Activity:  Nustep L3 B UE/LE (seat position 8): 8 minutes with no rest breaks  Forward walking in hallway with use of SPC in RUE, ~70 feet total  Forward marching in // bars, with touch assist from BUE to maintain balance when standing in SLS on RLE, 3 x down and back   Lateral walking in hallway with anterior cone taps and use of SPC in RUE, ~ 70 feet  Lateral walking on blue foam pad in // bars, 3 x down and back each and bilaterally  Lateral step ups to 3-inch step, 1 x 15 bilaterally  Not performed on this day:  Seated/ standing hamstring stretches (cuing for proper technique/ static holds).   Seated Marches and LAQs, 20x each  Step ups to 6-inch stair, 2 x 12  Walking over uneven surfaces outdoors (grass, gravel rocks), ~70 ft total Standing on foam pad with ball toss, 2 x 40 seconds Standing on firm surface and foam pad with random Blaze pod tapping overhead, 3 x 45 seconds (requires LUE for balance support on HR despite verbal cues)  Sit to stands from gray chair with UE assist (focus on symmetrical WB through bilateral LEs), 1 x 10  Lateral resisted walking in // bars, 2 Black Tbs, 3 x down and back  BOSU ball static balance, A/P and M/L weight shifting, 1-2 minutes each Standing static balance on foam pad, eyes open and closed, narrow BOS, 4 sets to failure Walking in // bars with forward and lateral step over 6 inch hurdles, and anterior cone taps with bilateral LE to challenge SLS, 4 x down and back  Sit to stands from gray chair height with blue Airex pad (without use of bilateral UEs), 2 x 10  Forward walking outdoors  over uneven grass and gravel surfaces with use of SPC in RUE, 100 ~ ft total Static standing on Airexpad with and without eyes closed, 2 x 30 seconds Forward walking in hallway with weaving in and out of cones and stepping onto Airexpads with use of SPC in RUE, ~ 70 ft total  Therapeutic Exercise:  Standing hip abduction and extension with  visual feedback from mirror and bilateral UE support from // bars, 2 x 12 bilaterally each  PATIENT EDUCATION:  Education details: Anatomy involved, prognosis, POC, HEP, importance of continued participation in community activities such as walking and yoga Person educated: Patient Education method: Explanation, Demonstration, Tactile cues, Verbal cues, and Handouts Education comprehension: verbalized understanding and returned demonstration  HOME EXERCISE PROGRAM: Access Code: [email or web address] URL: [email or web address] Date: 02/21/2024 Prepared by: Ozell Sero Exercises - Standing Hip Extension with Counter Support - 1 x daily - 7 x weekly - 2 sets - 12 reps - Standing Hip Abduction with Counter Support - 1 x daily - 7 x weekly - 2 sets - 12 reps - Standing Hamstring Stretch with Step - 1 x daily - 7 x weekly - 3 sets - 10 reps - 20 sec. hold - Supine March - 1 x daily - 7 x weekly - 2 sets - 10 reps   ASSESSMENT:  CLINICAL IMPRESSION: Focus of today's tx session was to improve right hip musculature strength and endurance. Pt was introduced to lateral walking in hallway with anterior cone taps with use of SPC in RUE which he tolerated well but was noted to have increased difficulty with SLS on RLE secondary to current deficits in right hip strength and endurance. Pt also introduced to lateral step ups to 3-inch step which he tolerated well but did require use of BUE usage on // bars. Pt continues to require frequent verbal and tactile cueing during ambulatory activities to promote upright posture and to avoid excessive cervical and thoracic flexion which he is able to correct momentarily but tendency to flex upper body forwards with fatigue. Pt reported no increase in right knee pain at end of tx session. Will continue to monitor and progress as able.    OBJECTIVE IMPAIRMENTS: Abnormal gait, decreased activity tolerance, decreased balance, decreased endurance, decreased knowledge of use of  DME, difficulty walking, decreased ROM, decreased strength, postural dysfunction, and pain.   ACTIVITY LIMITATIONS: carrying, lifting, standing, and transfers  PARTICIPATION LIMITATIONS: cleaning, laundry, and community activity  PERSONAL FACTORS: Age and 1-2 comorbidities: hypertension and chronic low back pain are also affecting patient's functional outcome.   REHAB POTENTIAL: Good  CLINICAL DECISION MAKING: Evolving/moderate complexity  EVALUATION COMPLEXITY: High   GOALS: Goals reviewed with patient? Yes  SHORT TERM GOALS: Target date: 04/18/2024 Pt to increase gross bilateral hip strength to at least a 4/5 in order to improve ability to stand for prolonged periods of time which is required for attending church services.  Baseline:see above. 10/23: 4/5 bilaterally and 4-/5 bilateral hip abduction Goal status: On-going  2.  Pt to experience no greater than 6/10 right knee pain at it's worst to improve ability to complete household tasks with greater ease.  Baseline: 10/10 at worst. 10/23: 6/10 Goal status: GOAL MET  LONG TERM GOALS: Target date: 05/02/2024  Pt to increase 5xSTS score to at least 15 seconds without the use of bilateral upper extremity support a to improve independence with functional transfers and decrease fall risk.  Baseline: 24.47 seconds with bilateral UE usage, (11/11): 16.44 seconds with BUE  usage Goal status: Partially met  2.  Pt to increase LEFs score to at least 49/80 to improve self-confidence of functional abilities with daily activities.  Baseline: 31/80, (11/11): 36/80 Goal status: Partially met  3.  Pt to improve gait mechanics without significant valgus of right knee and an improved upright posture with the appropriately chosen assistive device so that he experiences greater ease with ambulating [place] distances with his wife.  Baseline: see above (11/11): significant valgus of right knee with use of SPC in RUE  Goal status:  On-going  PLAN:  PT FREQUENCY: 2x/week  PT DURATION: 4 weeks  PLANNED INTERVENTIONS: 97164- PT Re-evaluation, 97110-Therapeutic exercises, 97530- Therapeutic activity, 97112- Neuromuscular re-education, 97535- Self Care, [id number]- Manual therapy, Patient/Family education, Balance training, Cryotherapy, and Moist heat  PLAN FOR NEXT SESSION: Continue gross LE strengthening for carryover for functional gait activities. Continue ambulatory activities with SPC and promote upright posture with all activities.   Curtistine Bracket, SPT  Ozell JAYSON Sero, PT, DPT # [phone] 04/06/2024, 10:30 AM

## 2024-04-11 ENCOUNTER — Encounter: Payer: Self-pay | Admitting: Physical Therapy

## 2024-04-11 ENCOUNTER — Ambulatory Visit: Admitting: Physical Therapy

## 2024-04-11 DIAGNOSIS — R2689 Other abnormalities of gait and mobility: Secondary | ICD-10-CM

## 2024-04-11 DIAGNOSIS — M2170 Unequal limb length (acquired), unspecified site: Secondary | ICD-10-CM

## 2024-04-11 DIAGNOSIS — M1710 Unilateral primary osteoarthritis, unspecified knee: Secondary | ICD-10-CM

## 2024-04-11 DIAGNOSIS — G8929 Other chronic pain: Secondary | ICD-10-CM

## 2024-04-11 DIAGNOSIS — M6281 Muscle weakness (generalized): Secondary | ICD-10-CM | POA: Diagnosis not present

## 2024-04-11 NOTE — Therapy (Signed)
 OUTPATIENT PHYSICAL THERAPY LOWER EXTREMITY TREATMENT  Patient Name: Taylor Mcbride MRN: [id number] DOB:1946-04-04, 78 y.o., male Today's Date: 04/11/2024  END OF SESSION:  PT End of Session - 04/11/24 0857     Visit Number 12    Number of Visits 18    Date for Recertification  05/02/24    PT Start Time 0857    PT Stop Time 0942    PT Time Calculation (min) 45 min    Equipment Utilized During Treatment Gait belt    Activity Tolerance Patient tolerated treatment well;Patient limited by fatigue;Patient limited by pain    Behavior During Therapy [facility] for tasks assessed/performed          Past Medical History:  Diagnosis Date   Alcohol abuse, in remission    Hyperlipidemia    Hypertension    Spinal stenosis in cervical region    Past Surgical History:  Procedure Laterality Date   CARDIOVASCULAR STRESS TEST  2006   normal, during admission for chest pain,  [place]    COLONOSCOPY WITH PROPOFOL  N/A 05/08/2019   Procedure: COLONOSCOPY WITH PROPOFOL ;  Surgeon: Toledo, Ladell POUR, MD;  Location: [facility] ENDOSCOPY;  Service: Gastroenterology;  Laterality: N/A;   HERNIA REPAIR  1996   right inguinal hernia repair with mesh   SMALL INTESTINE SURGERY     cerivcal disk fusion   Patient Active Problem List   Diagnosis Date Noted   Hypertension 01/05/2012   Hyperlipidemia with target low density lipoprotein (LDL) cholesterol less than 100 mg/dL 91/86/7986   Arrhythmia 01/05/2012   Chronic back pain greater than 3 months duration 01/05/2012   Spinal stenosis in cervical region    Alcohol abuse, in remission    PCP: Delfina Pao, MD  REFERRING PROVIDER: Ewing, Lamar RIGGERS  REFERRING DIAG:  M17.11 (ICD-10-CM) - Unilateral primary osteoarthritis, right knee  M25.561 (ICD-10-CM) - Pain in right knee  G89.29 (ICD-10-CM) - Other chronic pain  R26.81 (ICD-10-CM) - Unsteadiness on feet   THERAPY DIAG:  Muscle weakness (generalized)  Chronic pain of right knee  Other abnormalities  of gait and mobility  Unilateral osteoarthritis of knee  Acquired leg length discrepancy  Rationale for Evaluation and Treatment: Rehabilitation  ONSET DATE: ~August 2025  SUBJECTIVE:   SUBJECTIVE STATEMENT: Pt is a 78 year old male who presents to physical therapy with concerns of chronic right knee pain and general lower extremity strength/endurance concerns affecting his functional independence. Pt has 0/10 right knee pain at rest but shooting pain can raise to a 10/10 with standing for long periods of time at church or walking for long periods of time in the community with his wife. Pt states that he feels fatigued with these activities and has noticed occasional instances of his right kne giving out. Pt reports that this is a chronic issue that has started irritating him more within the past two months. Pain is alleviated with rest and 1 week usage of right knee brace may be helping too, I'm not sure. Pt reports an allergy to NSAIDs so he is unable to take them for pain management at this time.   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 Pt reports having 4 metal rods placed in his right ankle which is why he has been wearing a brace over his right ankle for years. Pt has not used orthotic in right sneaker but does report that his left leg is longer than my right. Pt ambulating with SPC in clinic on this  day but states that this is new for him and that he has been walking at home without a device for the most part. Pt reports being independent at home and participates in yoga twice a week for general stretching/core strengthening.  PAIN:  Are you having pain? Yes: NPRS scale: 0/10 current, 10/10 at worst Pain location: General right knee pain Pain description: shooting Aggravating factors: Prolonged standing, prolonged walking, pivoting/turning at home, walking on outdoor surfaces Relieving factors: Rest  PRECAUTIONS: None  RED  FLAGS: None   WEIGHT BEARING RESTRICTIONS: No  FALLS:  Has patient fallen in last 6 months? Yes. Number of falls 1 (outdoors due to multitasking, assisted by family to get back up)  LIVING ENVIRONMENT: Lives with: lives with their spouse Lives in: House/apartment Stairs: No Has following equipment at home: Single point cane  OCCUPATION: Retired  PLOF: Independent  PATIENT GOALS: To decrease shooting pain in right knee and improve lower extremity strength so that he may return to outdoor walking with his wife and dog with greater ease  NEXT MD VISIT: None scheduled currently  OBJECTIVE:  Note: Objective measures were completed at Evaluation unless otherwise noted.  DIAGNOSTIC FINDINGS: N/A.  See care everywhere for [facility] records.  PATIENT SURVEYS:  LEFS: 31/80  COGNITION: Overall cognitive status: Within functional limits for tasks assessed     SENSATION: Light touch: WFL  MUSCLE LENGTH: Hamstrings: Right 75 deg; Left 65 deg   POSTURE: rounded shoulders, forward head, increased thoracic kyphosis, flexed trunk , and weight shift left  PALPATION: No significant tenderness or pain to touch  LOWER EXTREMITY ROM:  Active ROM Right eval Left eval  Hip flexion 94 deg 92 deg  Hip extension    Hip abduction    Hip adduction    Hip internal rotation 18 deg 14 deg  Hip external rotation 24 deg 29 deg  Knee flexion 121 deg 119 deg  Knee extension -5 deg -1 deg  Ankle dorsiflexion    Ankle plantarflexion    Ankle inversion    Ankle eversion     (Blank rows = not tested)  LOWER EXTREMITY MMT:  MMT Right eval Left eval  Hip flexion -4 -4  Hip extension    Hip abduction 4- 4-  Hip adduction 4- 4  Hip internal rotation 4- 4-  Hip external rotation 4- 4-  Knee flexion 4- 4-  Knee extension 4- 4+  Ankle dorsiflexion NT 4+  Ankle plantarflexion    Ankle inversion    Ankle eversion     (Blank rows = not tested)  LOWER EXTREMITY SPECIAL TESTS:  FADDIR:  Negative SLR: Negative   FUNCTIONAL TESTS:  5xSTS: 24.47 seconds (with bilateral UE support from chair arms)  GAIT: Distance walked: 40 ft around clinic Assistive device utilized: Single point cane and None Level of assistance: Modified independence Comments: Pt ambulated with SPC in RUE for support and was observed to have wide BOS with SPC and lateral trunk lean to left side. Pt ambulated with bilateral shortened step length, forward head posture and rounded shoulders, and decreased cadence. Pt ambulated with a step to gait pattern and decreased hip extension noted during toe off phase. Pt with significant right knee valgus noted during gait and during static stance.    Leg Length from umbilicus: symmetrical bilaterally Right leg length from ASIS: 103 cm (decreased right knee flexion and knee valgus present) Left leg length from ASIS: 100 cm  BERG: 43/50  FGA 10/30 (without use of an  assistive device) mCTSIB: significant sway with EO and EC on foam pad  03/16/2024 MMT Right eval Left eval  Hip flexion 4 4  Hip extension    Hip abduction 4- 4-  Hip adduction 4 4   5xSTS: 16.44 seconds with use of BUE support LEFs: 36 out of 80                                                                                                                               TREATMENT DATE: 04/11/2024    Subjective: Pt reports mild right knee tenderness at start of treatment session but no significant pain.  Pt presents to physical therapy with SPC usage in RUE, right knee brace and cloth wrap around right ankle. Pt received soft brace in the mail from [employer] that the Orthotist/Prosthetics at [facility] in [place] recommended.   Therapeutic Activity:  Nustep L3 B UE/LE (seat position 8): 8 minutes with no rest breaks  Sit to stands from gray chair height with blue Airex pad (without use of bilateral UEs), 2 x 10   Seated Marches and LAQs, 3#, 20x each   Forward walking in hallway with use  of SPC in RUE, ~70 feet total  Forward marching in // bars, with touch assist from BUE to maintain balance when standing in SLS on RLE, 3 x down and back   Lateral walking on blue foam pad in // bars, 3 x down and back each and bilaterally   Static standing on Airexpad with and without eyes closed, 2 x 30 seconds   Forward step ups to 6-inch stair, 2 x 10    Not performed on this day:  Seated/ standing hamstring stretches (cuing for proper technique/ static holds).   Walking over uneven surfaces outdoors (grass, gravel rocks), ~70 ft total Standing on foam pad with ball toss, 2 x 40 seconds Standing on firm surface and foam pad with random Blaze pod tapping overhead, 3 x 45 seconds (requires LUE for balance support on HR despite verbal cues)  Lateral resisted walking in // bars, 2 Black Tbs, 3 x down and back  BOSU ball static balance, A/P and M/L weight shifting, 1-2 minutes each Standing static balance on foam pad, eyes open and closed, narrow BOS, 4 sets to failure Walking in // bars with forward and lateral step over 6 inch hurdles, and anterior cone taps with bilateral LE to challenge SLS, 4 x down and back  Forward walking outdoors over uneven grass and gravel surfaces with use of SPC in RUE, 100 ~ ft total Forward walking in hallway with weaving in and out of cones and stepping onto Airexpads with use of SPC in RUE, ~ 70 ft total  Sit to stands from gray chair with UE assist (focus on symmetrical WB through bilateral LEs), 1 x 10    Lateral step ups to 3-inch step, 1 x 15 bilaterally  Lateral walking in hallway with anterior  cone taps and use of SPC in RUE, ~ 70 feet  Therapeutic Exercise:  Standing hip abduction and extension with visual feedback from mirror and bilateral UE support from // bars, 2 x 12 bilaterally each  PATIENT EDUCATION:  Education details: Anatomy involved, prognosis, POC, HEP, importance of continued participation in community activities such as walking and  yoga Person educated: Patient Education method: Explanation, Demonstration, Tactile cues, Verbal cues, and Handouts Education comprehension: verbalized understanding and returned demonstration  HOME EXERCISE PROGRAM: Access Code: [email or web address] URL: [email or web address] Date: 02/21/2024 Prepared by: Ozell Sero Exercises - Standing Hip Extension with Counter Support - 1 x daily - 7 x weekly - 2 sets - 12 reps - Standing Hip Abduction with Counter Support - 1 x daily - 7 x weekly - 2 sets - 12 reps - Standing Hamstring Stretch with Step - 1 x daily - 7 x weekly - 3 sets - 10 reps - 20 sec. hold - Supine March - 1 x daily - 7 x weekly - 2 sets - 10 reps   ASSESSMENT:  CLINICAL IMPRESSION: Pt with improved ability to perform sit to stand transfers from gray chair with Airex pad without BUE assistance off of chair on this day as he was able to complete the entire intended number of reps/sets without fatigue or LOB. Pt progressed to completing seated marches and LAQs with increased resistance on this day which he tolerated well. Pt continues to require frequent verbal and tactile cueing during ambulatory activities to promote upright posture and to avoid excessive forward flexion of upper trunk when fatigued which he is able to correct momentarily. Pt reported right ankle and right knee pain during forward marches in // bars and hallway ambulation but symptoms had returned to baseline after seated rest break at end of tx session. Will continue to monitor and progress as able.   OBJECTIVE IMPAIRMENTS: Abnormal gait, decreased activity tolerance, decreased balance, decreased endurance, decreased knowledge of use of DME, difficulty walking, decreased ROM, decreased strength, postural dysfunction, and pain.   ACTIVITY LIMITATIONS: carrying, lifting, standing, and transfers  PARTICIPATION LIMITATIONS: cleaning, laundry, and community activity  PERSONAL FACTORS: Age and 1-2 comorbidities:  hypertension and chronic low back pain are also affecting patient's functional outcome.   REHAB POTENTIAL: Good  CLINICAL DECISION MAKING: Evolving/moderate complexity  EVALUATION COMPLEXITY: High   GOALS: Goals reviewed with patient? Yes  SHORT TERM GOALS: Target date: 04/18/2024 Pt to increase gross bilateral hip strength to at least a 4/5 in order to improve ability to stand for prolonged periods of time which is required for attending church services.  Baseline:see above. 10/23: 4/5 bilaterally and 4-/5 bilateral hip abduction Goal status: On-going  2.  Pt to experience no greater than 6/10 right knee pain at it's worst to improve ability to complete household tasks with greater ease.  Baseline: 10/10 at worst. 10/23: 6/10 Goal status: GOAL MET  LONG TERM GOALS: Target date: 05/02/2024  Pt to increase 5xSTS score to at least 15 seconds without the use of bilateral upper extremity support a to improve independence with functional transfers and decrease fall risk.  Baseline: 24.47 seconds with bilateral UE usage, (11/11): 16.44 seconds with BUE usage Goal status: Partially met  2.  Pt to increase LEFs score to at least 49/80 to improve self-confidence of functional abilities with daily activities.  Baseline: 31/80, (11/11): 36/80 Goal status: Partially met  3.  Pt to improve gait mechanics without significant valgus of right knee and an improved upright  posture with the appropriately chosen assistive device so that he experiences greater ease with ambulating [place] distances with his wife.  Baseline: see above (11/11): significant valgus of right knee with use of SPC in RUE  Goal status: On-going  PLAN:  PT FREQUENCY: 2x/week  PT DURATION: 4 weeks  PLANNED INTERVENTIONS: 97164- PT Re-evaluation, 97110-Therapeutic exercises, 97530- Therapeutic activity, 97112- Neuromuscular re-education, 97535- Self Care, [id number]- Manual therapy, Patient/Family education, Balance  training, Cryotherapy, and Moist heat  PLAN FOR NEXT SESSION: Continue gross LE strengthening for carryover for functional gait activities. Continue ambulatory activities with SPC and promote upright posture with all activities.   Curtistine Bracket, SPT  Ozell JAYSON Sero, PT, DPT # [phone] 04/11/2024, 8:58 AM

## 2024-04-13 ENCOUNTER — Ambulatory Visit: Admitting: Physical Therapy

## 2024-04-13 DIAGNOSIS — M6281 Muscle weakness (generalized): Secondary | ICD-10-CM | POA: Diagnosis not present

## 2024-04-13 DIAGNOSIS — R2689 Other abnormalities of gait and mobility: Secondary | ICD-10-CM

## 2024-04-13 DIAGNOSIS — G8929 Other chronic pain: Secondary | ICD-10-CM

## 2024-04-13 DIAGNOSIS — M2170 Unequal limb length (acquired), unspecified site: Secondary | ICD-10-CM

## 2024-04-13 DIAGNOSIS — M1710 Unilateral primary osteoarthritis, unspecified knee: Secondary | ICD-10-CM

## 2024-04-13 NOTE — Therapy (Signed)
 OUTPATIENT PHYSICAL THERAPY LOWER EXTREMITY TREATMENT  Patient Name: Taylor Mcbride MRN: [id number] DOB:[birth year], 78 y.o., male Today's Date: 04/13/2024  END OF SESSION:  PT End of Session - 04/13/24 0910     Visit Number 13    Number of Visits 18    Date for Recertification  05/02/24    PT Start Time 0901    PT Stop Time 0946    PT Time Calculation (min) 45 min    Equipment Utilized During Treatment Gait belt    Activity Tolerance Patient tolerated treatment well;Patient limited by fatigue;Patient limited by pain    Behavior During Therapy [facility] for tasks assessed/performed           Past Medical History:  Diagnosis Date   Alcohol abuse, in remission    Hyperlipidemia    Hypertension    Spinal stenosis in cervical region    Past Surgical History:  Procedure Laterality Date   CARDIOVASCULAR STRESS TEST  2006   normal, during admission for chest pain,  [place]    COLONOSCOPY WITH PROPOFOL  N/A 05/08/2019   Procedure: COLONOSCOPY WITH PROPOFOL ;  Surgeon: Toledo, Ladell POUR, MD;  Location: [facility] ENDOSCOPY;  Service: Gastroenterology;  Laterality: N/A;   HERNIA REPAIR  1996   right inguinal hernia repair with mesh   SMALL INTESTINE SURGERY     cerivcal disk fusion   Patient Active Problem List   Diagnosis Date Noted   Hypertension 01/05/2012   Hyperlipidemia with target low density lipoprotein (LDL) cholesterol less than 100 mg/dL 91/86/7986   Arrhythmia 01/05/2012   Chronic back pain greater than 3 months duration 01/05/2012   Spinal stenosis in cervical region    Alcohol abuse, in remission    PCP: Delfina Pao, MD  REFERRING PROVIDER: Ewing, Lamar RIGGERS  REFERRING DIAG:  M17.11 (ICD-10-CM) - Unilateral primary osteoarthritis, right knee  M25.561 (ICD-10-CM) - Pain in right knee  G89.29 (ICD-10-CM) - Other chronic pain  R26.81 (ICD-10-CM) - Unsteadiness on feet   THERAPY DIAG:  Muscle weakness (generalized)  Chronic pain of right knee  Other  abnormalities of gait and mobility  Unilateral osteoarthritis of knee  Acquired leg length discrepancy  Rationale for Evaluation and Treatment: Rehabilitation  ONSET DATE: ~August 2025  SUBJECTIVE:   SUBJECTIVE STATEMENT: Pt is a 78 year old male who presents to physical therapy with concerns of chronic right knee pain and general lower extremity strength/endurance concerns affecting his functional independence. Pt has 0/10 right knee pain at rest but shooting pain can raise to a 10/10 with standing for long periods of time at church or walking for long periods of time in the community with his wife. Pt states that he feels fatigued with these activities and has noticed occasional instances of his right kne giving out. Pt reports that this is a chronic issue that has started irritating him more within the past two months. Pain is alleviated with rest and 1 week usage of right knee brace may be helping too, I'm not sure. Pt reports an allergy to NSAIDs so he is unable to take them for pain management at this time.   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 Pt reports having 4 metal rods placed in his right ankle which is why he has been wearing a brace over his right ankle for years. Pt has not used orthotic in right sneaker but does report that his left leg is longer than my right. Pt ambulating with SPC in clinic on  this day but states that this is new for him and that he has been walking at home without a device for the most part. Pt reports being independent at home and participates in yoga twice a week for general stretching/core strengthening.  PAIN:  Are you having pain? Yes: NPRS scale: 0/10 current, 10/10 at worst Pain location: General right knee pain Pain description: shooting Aggravating factors: Prolonged standing, prolonged walking, pivoting/turning at home, walking on outdoor surfaces Relieving factors: Rest  PRECAUTIONS:  None  RED FLAGS: None   WEIGHT BEARING RESTRICTIONS: No  FALLS:  Has patient fallen in last 6 months? Yes. Number of falls 1 (outdoors due to multitasking, assisted by family to get back up)  LIVING ENVIRONMENT: Lives with: lives with their spouse Lives in: House/apartment Stairs: No Has following equipment at home: Single point cane  OCCUPATION: Retired  PLOF: Independent  PATIENT GOALS: To decrease shooting pain in right knee and improve lower extremity strength so that he may return to outdoor walking with his wife and dog with greater ease  NEXT MD VISIT: None scheduled currently  OBJECTIVE:  Note: Objective measures were completed at Evaluation unless otherwise noted.  DIAGNOSTIC FINDINGS: N/A.  See care everywhere for [facility] records.  PATIENT SURVEYS:  LEFS: 31/80  COGNITION: Overall cognitive status: Within functional limits for tasks assessed     SENSATION: Light touch: WFL  MUSCLE LENGTH: Hamstrings: Right 75 deg; Left 65 deg   POSTURE: rounded shoulders, forward head, increased thoracic kyphosis, flexed trunk , and weight shift left  PALPATION: No significant tenderness or pain to touch  LOWER EXTREMITY ROM:  Active ROM Right eval Left eval  Hip flexion 94 deg 92 deg  Hip extension    Hip abduction    Hip adduction    Hip internal rotation 18 deg 14 deg  Hip external rotation 24 deg 29 deg  Knee flexion 121 deg 119 deg  Knee extension -5 deg -1 deg  Ankle dorsiflexion    Ankle plantarflexion    Ankle inversion    Ankle eversion     (Blank rows = not tested)  LOWER EXTREMITY MMT:  MMT Right eval Left eval  Hip flexion -4 -4  Hip extension    Hip abduction 4- 4-  Hip adduction 4- 4  Hip internal rotation 4- 4-  Hip external rotation 4- 4-  Knee flexion 4- 4-  Knee extension 4- 4+  Ankle dorsiflexion NT 4+  Ankle plantarflexion    Ankle inversion    Ankle eversion     (Blank rows = not tested)  LOWER EXTREMITY SPECIAL TESTS:   FADDIR: Negative SLR: Negative   FUNCTIONAL TESTS:  5xSTS: 24.47 seconds (with bilateral UE support from chair arms)  GAIT: Distance walked: 40 ft around clinic Assistive device utilized: Single point cane and None Level of assistance: Modified independence Comments: Pt ambulated with SPC in RUE for support and was observed to have wide BOS with SPC and lateral trunk lean to left side. Pt ambulated with bilateral shortened step length, forward head posture and rounded shoulders, and decreased cadence. Pt ambulated with a step to gait pattern and decreased hip extension noted during toe off phase. Pt with significant right knee valgus noted during gait and during static stance.    Leg Length from umbilicus: symmetrical bilaterally Right leg length from ASIS: 103 cm (decreased right knee flexion and knee valgus present) Left leg length from ASIS: 100 cm  BERG: 43/50  FGA 10/30 (without use of  an assistive device) mCTSIB: significant sway with EO and EC on foam pad  03/16/2024 MMT Right eval Left eval  Hip flexion 4 4  Hip extension    Hip abduction 4- 4-  Hip adduction 4 4   5xSTS: 16.44 seconds with use of BUE support LEFs: 36 out of 80                                                                                                                               TREATMENT DATE: 04/13/2024    Subjective: Pt reports mild right knee tenderness at start of treatment session but no significant pain.  Pt presents to physical therapy with SPC usage in RUE, right knee brace and soft brace around right ankle. Pt states that his neck was sore after last tx session which he attributes to using his arms with the Nu-step activity.   Therapeutic Activity:  Nustep L3 B UE/LE (seat position 8): 8 minutes with no rest breaks  Sit to stands from gray chair height (without use of bilateral UEs), 2 x 10   Seated Marches and LAQs, 3#, 20x each   Forward walking in hallway with use of SPC in  RUE, ~70 feet total  Forward marching in // bars, with touch assist from BUE to maintain balance when standing in SLS on RLE, 3 x down and back   Static standing on Airexpad with and without eyes closed, 2 x 30 seconds   Forward step ups to 6-inch stair, 2 x 10    Forward walking with anterolateral cone taps in // bars, 3 x down and back  Not performed on this day:  Seated/ standing hamstring stretches (cuing for proper technique/ static holds).   Walking over uneven surfaces outdoors (grass, gravel rocks), ~70 ft total Standing on foam pad with ball toss, 2 x 40 seconds Standing on firm surface and foam pad with random Blaze pod tapping overhead, 3 x 45 seconds (requires LUE for balance support on HR despite verbal cues)  Lateral resisted walking in // bars, 2 Black Tbs, 3 x down and back  BOSU ball static balance, A/P and M/L weight shifting, 1-2 minutes each Standing static balance on foam pad, eyes open and closed, narrow BOS, 4 sets to failure Walking in // bars with forward and lateral step over 6 inch hurdles, and anterior cone taps with bilateral LE to challenge SLS, 4 x down and back  Forward walking outdoors over uneven grass and gravel surfaces with use of SPC in RUE, 100 ~ ft total Forward walking in hallway with weaving in and out of cones and stepping onto Airexpads with use of SPC in RUE, ~ 70 ft total  Sit to stands from gray chair with UE assist (focus on symmetrical WB through bilateral LEs), 1 x 10    Lateral step ups to 3-inch step, 1 x 15 bilaterally  Lateral walking in hallway with anterior cone taps and  use of SPC in RUE, ~ 70 feet  Lateral walking on blue foam pad in // bars, 3 x down and back each and bilaterally   Therapeutic Exercise:  Standing hip abduction and extension with visual feedback from mirror and bilateral UE support from // bars, 2 x 12 bilaterally each  PATIENT EDUCATION:  Education details: Anatomy involved, prognosis, POC, HEP, importance of  continued participation in community activities such as walking and yoga Person educated: Patient Education method: Explanation, Demonstration, Tactile cues, Verbal cues, and Handouts Education comprehension: verbalized understanding and returned demonstration  HOME EXERCISE PROGRAM: Access Code: [email or web address] URL: [email or web address] Date: 02/21/2024 Prepared by: Ozell Sero Exercises - Standing Hip Extension with Counter Support - 1 x daily - 7 x weekly - 2 sets - 12 reps - Standing Hip Abduction with Counter Support - 1 x daily - 7 x weekly - 2 sets - 12 reps - Standing Hamstring Stretch with Step - 1 x daily - 7 x weekly - 3 sets - 10 reps - 20 sec. hold - Supine March - 1 x daily - 7 x weekly - 2 sets - 10 reps   ASSESSMENT:  CLINICAL IMPRESSION: Pt with improved ability to perform sit to stand transfers from gray chair without the use of an Airex pad and without BUE assistance off of chair on this day as he was able to complete the entire intended number of reps/sets without fatigue or LOB. Pt was introduced to forward walking in // bars with anterolateral cone taps which he was able to complete with RUE assist on // bars. Pt continues to require frequent verbal and tactile cueing during ambulatory activities to promote upright posture and to avoid excessive forward flexion of upper trunk when fatigued which he is able to correct momentarily. Pt reported no significant increase in right knee or right ankle at end of tx session. Will continue to monitor and progress as able.   OBJECTIVE IMPAIRMENTS: Abnormal gait, decreased activity tolerance, decreased balance, decreased endurance, decreased knowledge of use of DME, difficulty walking, decreased ROM, decreased strength, postural dysfunction, and pain.   ACTIVITY LIMITATIONS: carrying, lifting, standing, and transfers  PARTICIPATION LIMITATIONS: cleaning, laundry, and community activity  PERSONAL FACTORS: Age and 1-2  comorbidities: hypertension and chronic low back pain are also affecting patient's functional outcome.   REHAB POTENTIAL: Good  CLINICAL DECISION MAKING: Evolving/moderate complexity  EVALUATION COMPLEXITY: High   GOALS: Goals reviewed with patient? Yes  SHORT TERM GOALS: Target date: 04/18/2024 Pt to increase gross bilateral hip strength to at least a 4/5 in order to improve ability to stand for prolonged periods of time which is required for attending church services.  Baseline:see above. 10/23: 4/5 bilaterally and 4-/5 bilateral hip abduction Goal status: On-going  2.  Pt to experience no greater than 6/10 right knee pain at it's worst to improve ability to complete household tasks with greater ease.  Baseline: 10/10 at worst. 10/23: 6/10 Goal status: GOAL MET  LONG TERM GOALS: Target date: 05/02/2024  Pt to increase 5xSTS score to at least 15 seconds without the use of bilateral upper extremity support a to improve independence with functional transfers and decrease fall risk.  Baseline: 24.47 seconds with bilateral UE usage, (11/11): 16.44 seconds with BUE usage Goal status: Partially met  2.  Pt to increase LEFs score to at least 49/80 to improve self-confidence of functional abilities with daily activities.  Baseline: 31/80, (11/11): 36/80 Goal status: Partially met  3.  Pt to improve  gait mechanics without significant valgus of right knee and an improved upright posture with the appropriately chosen assistive device so that he experiences greater ease with ambulating [place] distances with his wife.  Baseline: see above (11/11): significant valgus of right knee with use of SPC in RUE  Goal status: On-going  PLAN:  PT FREQUENCY: 2x/week  PT DURATION: 4 weeks  PLANNED INTERVENTIONS: 97164- PT Re-evaluation, 97110-Therapeutic exercises, 97530- Therapeutic activity, 97112- Neuromuscular re-education, 97535- Self Care, [id number]- Manual therapy, Patient/Family education,  Balance training, Cryotherapy, and Moist heat  PLAN FOR NEXT SESSION: Continue gross LE strengthening for carryover for functional gait activities. Continue ambulatory activities with SPC and promote upright posture with all activities.    Curtistine Bracket, SPT  Ozell JAYSON Sero, PT, DPT # [phone] 04/13/2024, 9:10 AM

## 2024-04-18 ENCOUNTER — Ambulatory Visit: Admitting: Physical Therapy

## 2024-04-18 ENCOUNTER — Encounter: Payer: Self-pay | Admitting: Physical Therapy

## 2024-04-18 DIAGNOSIS — R2689 Other abnormalities of gait and mobility: Secondary | ICD-10-CM

## 2024-04-18 DIAGNOSIS — M6281 Muscle weakness (generalized): Secondary | ICD-10-CM

## 2024-04-18 DIAGNOSIS — M1710 Unilateral primary osteoarthritis, unspecified knee: Secondary | ICD-10-CM

## 2024-04-18 DIAGNOSIS — G8929 Other chronic pain: Secondary | ICD-10-CM

## 2024-04-18 DIAGNOSIS — M2170 Unequal limb length (acquired), unspecified site: Secondary | ICD-10-CM

## 2024-04-18 NOTE — Therapy (Signed)
 OUTPATIENT PHYSICAL THERAPY LOWER EXTREMITY TREATMENT  Patient Name: Taylor Mcbride MRN: [id number] DOB:Sep 14, 1945, 78 y.o., male Today's Date: 04/18/2024  END OF SESSION:  PT End of Session - 04/18/24 0948     Visit Number 14    Number of Visits 18    Date for Recertification  05/02/24    PT Start Time 0944 to 1030  (46 minutes)   Equipment Utilized During Treatment Gait belt    Activity Tolerance Patient tolerated treatment well;Patient limited by fatigue;Patient limited by pain    Behavior During Therapy [facility] for tasks assessed/performed         Past Medical History:  Diagnosis Date   Alcohol abuse, in remission    Hyperlipidemia    Hypertension    Spinal stenosis in cervical region    Past Surgical History:  Procedure Laterality Date   CARDIOVASCULAR STRESS TEST  2006   normal, during admission for chest pain,  [place]    COLONOSCOPY WITH PROPOFOL  N/A 05/08/2019   Procedure: COLONOSCOPY WITH PROPOFOL ;  Surgeon: Toledo, Ladell POUR, MD;  Location: [facility] ENDOSCOPY;  Service: Gastroenterology;  Laterality: N/A;   HERNIA REPAIR  1996   right inguinal hernia repair with mesh   SMALL INTESTINE SURGERY     cerivcal disk fusion   Patient Active Problem List   Diagnosis Date Noted   Hypertension 01/05/2012   Hyperlipidemia with target low density lipoprotein (LDL) cholesterol less than 100 mg/dL 91/86/7986   Arrhythmia 01/05/2012   Chronic back pain greater than 3 months duration 01/05/2012   Spinal stenosis in cervical region    Alcohol abuse, in remission    PCP: Delfina Pao, MD  REFERRING PROVIDER: Ewing, Lamar RIGGERS  REFERRING DIAG:  M17.11 (ICD-10-CM) - Unilateral primary osteoarthritis, right knee  M25.561 (ICD-10-CM) - Pain in right knee  G89.29 (ICD-10-CM) - Other chronic pain  R26.81 (ICD-10-CM) - Unsteadiness on feet   THERAPY DIAG:  Muscle weakness (generalized)  Chronic pain of right knee  Other abnormalities of gait and mobility  Unilateral  osteoarthritis of knee  Acquired leg length discrepancy  Rationale for Evaluation and Treatment: Rehabilitation  ONSET DATE: ~August 2025  SUBJECTIVE:   SUBJECTIVE STATEMENT: Pt is a 78 year old male who presents to physical therapy with concerns of chronic right knee pain and general lower extremity strength/endurance concerns affecting his functional independence. Pt has 0/10 right knee pain at rest but shooting pain can raise to a 10/10 with standing for long periods of time at church or walking for long periods of time in the community with his wife. Pt states that he feels fatigued with these activities and has noticed occasional instances of his right kne giving out. Pt reports that this is a chronic issue that has started irritating him more within the past two months. Pain is alleviated with rest and 1 week usage of right knee brace may be helping too, I'm not sure. Pt reports an allergy to NSAIDs so he is unable to take them for pain management at this time.   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 Pt reports having 4 metal rods placed in his right ankle which is why he has been wearing a brace over his right ankle for years. Pt has not used orthotic in right sneaker but does report that his left leg is longer than my right. Pt ambulating with SPC in clinic on this day but states that this is new for him and that he has  been walking at home without a device for the most part. Pt reports being independent at home and participates in yoga twice a week for general stretching/core strengthening.  PAIN:  Are you having pain? Yes: NPRS scale: 0/10 current, 10/10 at worst Pain location: General right knee pain Pain description: shooting Aggravating factors: Prolonged standing, prolonged walking, pivoting/turning at home, walking on outdoor surfaces Relieving factors: Rest  PRECAUTIONS: None  RED FLAGS: None   WEIGHT BEARING  RESTRICTIONS: No  FALLS:  Has patient fallen in last 6 months? Yes. Number of falls 1 (outdoors due to multitasking, assisted by family to get back up)  LIVING ENVIRONMENT: Lives with: lives with their spouse Lives in: House/apartment Stairs: No Has following equipment at home: Single point cane  OCCUPATION: Retired  PLOF: Independent  PATIENT GOALS: To decrease shooting pain in right knee and improve lower extremity strength so that he may return to outdoor walking with his wife and dog with greater ease  NEXT MD VISIT: None scheduled currently  OBJECTIVE:  Note: Objective measures were completed at Evaluation unless otherwise noted.  DIAGNOSTIC FINDINGS: N/A.  See care everywhere for [facility] records.  PATIENT SURVEYS:  LEFS: 31/80  COGNITION: Overall cognitive status: Within functional limits for tasks assessed     SENSATION: Light touch: WFL  MUSCLE LENGTH: Hamstrings: Right 75 deg; Left 65 deg   POSTURE: rounded shoulders, forward head, increased thoracic kyphosis, flexed trunk , and weight shift left  PALPATION: No significant tenderness or pain to touch  LOWER EXTREMITY ROM:  Active ROM Right eval Left eval  Hip flexion 94 deg 92 deg  Hip extension    Hip abduction    Hip adduction    Hip internal rotation 18 deg 14 deg  Hip external rotation 24 deg 29 deg  Knee flexion 121 deg 119 deg  Knee extension -5 deg -1 deg  Ankle dorsiflexion    Ankle plantarflexion    Ankle inversion    Ankle eversion     (Blank rows = not tested)  LOWER EXTREMITY MMT:  MMT Right eval Left eval  Hip flexion -4 -4  Hip extension    Hip abduction 4- 4-  Hip adduction 4- 4  Hip internal rotation 4- 4-  Hip external rotation 4- 4-  Knee flexion 4- 4-  Knee extension 4- 4+  Ankle dorsiflexion NT 4+  Ankle plantarflexion    Ankle inversion    Ankle eversion     (Blank rows = not tested)  LOWER EXTREMITY SPECIAL TESTS:  FADDIR: Negative SLR: Negative    FUNCTIONAL TESTS:  5xSTS: 24.47 seconds (with bilateral UE support from chair arms)  GAIT: Distance walked: 40 ft around clinic Assistive device utilized: Single point cane and None Level of assistance: Modified independence Comments: Pt ambulated with SPC in RUE for support and was observed to have wide BOS with SPC and lateral trunk lean to left side. Pt ambulated with bilateral shortened step length, forward head posture and rounded shoulders, and decreased cadence. Pt ambulated with a step to gait pattern and decreased hip extension noted during toe off phase. Pt with significant right knee valgus noted during gait and during static stance.    Leg Length from umbilicus: symmetrical bilaterally Right leg length from ASIS: 103 cm (decreased right knee flexion and knee valgus present) Left leg length from ASIS: 100 cm  BERG: 43/50  FGA 10/30 (without use of an assistive device) mCTSIB: significant sway with EO and EC on foam pad  03/16/2024 MMT Right eval Left eval  Hip flexion 4 4  Hip extension    Hip abduction 4- 4-  Hip adduction 4 4   5xSTS: 16.44 seconds with use of BUE support LEFs: 36 out of 80                                                                                                                               TREATMENT DATE: 04/18/2024    Subjective:  Pt. states he had a near fall in kitchen this morning and R knee hit refrigerator.  No pain or tenderness reported.    PT assessed R knee prior to start of tx. Session.    Therapeutic Activity:  Nustep L3 B UE/LE (seat position 8): 10 minutes with no rest breaks.  3# ankle wts: walking in clinic/ hallway (182 feet)- moderate cuing to increase hip flexion/ prevent shuffling.    Seated Marches and LAQs, 3#, 20x each  Sit to stands from gray chair height (without use of bilateral UEs), 2 x 10   Forward marching in // bars, with touch assist from BUE to maintain balance when standing in SLS on RLE, 3 x down  and back   Static standing on Airexpad with and without eyes closed, 2 x 30 seconds   Forward step ups to 6-inch stair, 2 x 10      PATIENT EDUCATION:  Education details: Anatomy involved, prognosis, POC, HEP, importance of continued participation in community activities such as walking and yoga Person educated: Patient Education method: Explanation, Demonstration, Tactile cues, Verbal cues, and Handouts Education comprehension: verbalized understanding and returned demonstration  HOME EXERCISE PROGRAM: Access Code: [email or web address] URL: [email or web address] Date: 02/21/2024 Prepared by: Ozell Sero Exercises - Standing Hip Extension with Counter Support - 1 x daily - 7 x weekly - 2 sets - 12 reps - Standing Hip Abduction with Counter Support - 1 x daily - 7 x weekly - 2 sets - 12 reps - Standing Hamstring Stretch with Step - 1 x daily - 7 x weekly - 3 sets - 10 reps - 20 sec. hold - Supine March - 1 x daily - 7 x weekly - 2 sets - 10 reps   ASSESSMENT:  CLINICAL IMPRESSION: Pt with improved ability to perform sit to stand transfers from gray chair without BUE assistance for 2 sets of 10 reps.  Pt continues to require frequent verbal and tactile cueing during ambulatory activities to promote upright posture and to avoid excessive forward flexion of upper trunk when fatigued which he is able to correct momentarily. Pt reported no significant increase in right knee or right ankle at end of tx session. Will continue to monitor and progress as able.   OBJECTIVE IMPAIRMENTS: Abnormal gait, decreased activity tolerance, decreased balance, decreased endurance, decreased knowledge of use of DME, difficulty walking, decreased ROM, decreased strength, postural dysfunction, and pain.   ACTIVITY LIMITATIONS: carrying, lifting, standing, and transfers  PARTICIPATION LIMITATIONS:  cleaning, laundry, and community activity  PERSONAL FACTORS: Age and 1-2 comorbidities: hypertension and chronic  low back pain are also affecting patient's functional outcome.   REHAB POTENTIAL: Good  CLINICAL DECISION MAKING: Evolving/moderate complexity  EVALUATION COMPLEXITY: High   GOALS: Goals reviewed with patient? Yes  SHORT TERM GOALS: Target date: 04/18/2024 Pt to increase gross bilateral hip strength to at least a 4/5 in order to improve ability to stand for prolonged periods of time which is required for attending church services.  Baseline:see above. 10/23: 4/5 bilaterally and 4-/5 bilateral hip abduction Goal status: On-going  2.  Pt to experience no greater than 6/10 right knee pain at it's worst to improve ability to complete household tasks with greater ease.  Baseline: 10/10 at worst. 10/23: 6/10 Goal status: GOAL MET  LONG TERM GOALS: Target date: 05/02/2024  Pt to increase 5xSTS score to at least 15 seconds without the use of bilateral upper extremity support a to improve independence with functional transfers and decrease fall risk.  Baseline: 24.47 seconds with bilateral UE usage, (11/11): 16.44 seconds with BUE usage Goal status: Partially met  2.  Pt to increase LEFs score to at least 49/80 to improve self-confidence of functional abilities with daily activities.  Baseline: 31/80, (11/11): 36/80 Goal status: Partially met  3.  Pt to improve gait mechanics without significant valgus of right knee and an improved upright posture with the appropriately chosen assistive device so that he experiences greater ease with ambulating [place] distances with his wife.  Baseline: see above (11/11): significant valgus of right knee with use of SPC in RUE  Goal status: On-going  PLAN:  PT FREQUENCY: 2x/week  PT DURATION: 4 weeks  PLANNED INTERVENTIONS: 97164- PT Re-evaluation, 97110-Therapeutic exercises, 97530- Therapeutic activity, 97112- Neuromuscular re-education, 97535- Self Care, 02859- Manual therapy, Patient/Family education, Balance training, Cryotherapy, and  Moist heat  PLAN FOR NEXT SESSION: Continue gross LE strengthening for carryover for functional gait activities. Continue ambulatory activities with SPC and promote upright posture with all activities.  CHECK GOALS  Ozell JAYSON Sero, PT, DPT # [id number] 04/18/2024, 9:48 AM

## 2024-04-24 ENCOUNTER — Ambulatory Visit: Admitting: Physical Therapy

## 2024-04-26 ENCOUNTER — Ambulatory Visit: Attending: Physician Assistant | Admitting: Physical Therapy

## 2024-04-26 ENCOUNTER — Encounter: Payer: Self-pay | Admitting: Physical Therapy

## 2024-04-26 DIAGNOSIS — M1710 Unilateral primary osteoarthritis, unspecified knee: Secondary | ICD-10-CM | POA: Insufficient documentation

## 2024-04-26 DIAGNOSIS — M6281 Muscle weakness (generalized): Secondary | ICD-10-CM | POA: Insufficient documentation

## 2024-04-26 DIAGNOSIS — G8929 Other chronic pain: Secondary | ICD-10-CM | POA: Insufficient documentation

## 2024-04-26 DIAGNOSIS — M25561 Pain in right knee: Secondary | ICD-10-CM | POA: Diagnosis present

## 2024-04-26 DIAGNOSIS — R2689 Other abnormalities of gait and mobility: Secondary | ICD-10-CM | POA: Insufficient documentation

## 2024-04-26 DIAGNOSIS — M2170 Unequal limb length (acquired), unspecified site: Secondary | ICD-10-CM | POA: Diagnosis present

## 2024-04-26 NOTE — Therapy (Signed)
 OUTPATIENT PHYSICAL THERAPY LOWER EXTREMITY TREATMENT  Patient Name: Taylor Mcbride MRN: [id number] DOB:March 17, 1946, 78 y.o., male Today's Date: 04/26/2024  END OF SESSION:   PT End of Session - 04/26/24 0901     Visit Number 15    Number of Visits 18    Date for Recertification  05/02/24    PT Start Time 0901    PT Stop Time 0945    PT Time Calculation (min) 44 min    Equipment Utilized During Treatment Gait belt    Activity Tolerance Patient tolerated treatment well;Patient limited by fatigue;Patient limited by pain    Behavior During Therapy [facility] for tasks assessed/performed            Past Medical History:  Diagnosis Date   Alcohol abuse, in remission    Hyperlipidemia    Hypertension    Spinal stenosis in cervical region    Past Surgical History:  Procedure Laterality Date   CARDIOVASCULAR STRESS TEST  2006   normal, during admission for chest pain,  [place]    COLONOSCOPY WITH PROPOFOL  N/A 05/08/2019   Procedure: COLONOSCOPY WITH PROPOFOL ;  Surgeon: Toledo, Ladell POUR, MD;  Location: [facility] ENDOSCOPY;  Service: Gastroenterology;  Laterality: N/A;   HERNIA REPAIR  1996   right inguinal hernia repair with mesh   SMALL INTESTINE SURGERY     cerivcal disk fusion   Patient Active Problem List   Diagnosis Date Noted   Hypertension 01/05/2012   Hyperlipidemia with target low density lipoprotein (LDL) cholesterol less than 100 mg/dL 91/86/7986   Arrhythmia 01/05/2012   Chronic back pain greater than 3 months duration 01/05/2012   Spinal stenosis in cervical region    Alcohol abuse, in remission    PCP: Delfina Pao, MD  REFERRING PROVIDER: Ewing, Lamar RIGGERS  REFERRING DIAG:  M17.11 (ICD-10-CM) - Unilateral primary osteoarthritis, right knee  M25.561 (ICD-10-CM) - Pain in right knee  G89.29 (ICD-10-CM) - Other chronic pain  R26.81 (ICD-10-CM) - Unsteadiness on feet   THERAPY DIAG:  Muscle weakness (generalized)  Chronic pain of right knee  Other  abnormalities of gait and mobility  Unilateral osteoarthritis of knee  Acquired leg length discrepancy  Rationale for Evaluation and Treatment: Rehabilitation  ONSET DATE: ~August 2025  SUBJECTIVE:   SUBJECTIVE STATEMENT: Pt is a 78 year old male who presents to physical therapy with concerns of chronic right knee pain and general lower extremity strength/endurance concerns affecting his functional independence. Pt has 0/10 right knee pain at rest but shooting pain can raise to a 10/10 with standing for long periods of time at church or walking for long periods of time in the community with his wife. Pt states that he feels fatigued with these activities and has noticed occasional instances of his right kne giving out. Pt reports that this is a chronic issue that has started irritating him more within the past two months. Pain is alleviated with rest and 1 week usage of right knee brace may be helping too, I'm not sure. Pt reports an allergy to NSAIDs so he is unable to take them for pain management at this time.   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 Pt reports having 4 metal rods placed in his right ankle which is why he has been wearing a brace over his right ankle for years. Pt has not used orthotic in right sneaker but does report that his left leg is longer than my right. Pt ambulating with SPC in  clinic on this day but states that this is new for him and that he has been walking at home without a device for the most part. Pt reports being independent at home and participates in yoga twice a week for general stretching/core strengthening.  PAIN:  Are you having pain? Yes: NPRS scale: 0/10 current, 10/10 at worst Pain location: General right knee pain Pain description: shooting Aggravating factors: Prolonged standing, prolonged walking, pivoting/turning at home, walking on outdoor surfaces Relieving factors: Rest  PRECAUTIONS:  None  RED FLAGS: None   WEIGHT BEARING RESTRICTIONS: No  FALLS:  Has patient fallen in last 6 months? Yes. Number of falls 1 (outdoors due to multitasking, assisted by family to get back up)  LIVING ENVIRONMENT: Lives with: lives with their spouse Lives in: House/apartment Stairs: No Has following equipment at home: Single point cane  OCCUPATION: Retired  PLOF: Independent  PATIENT GOALS: To decrease shooting pain in right knee and improve lower extremity strength so that he may return to outdoor walking with his wife and dog with greater ease  NEXT MD VISIT: None scheduled currently  OBJECTIVE:  Note: Objective measures were completed at Evaluation unless otherwise noted.  DIAGNOSTIC FINDINGS: N/A.  See care everywhere for [facility] records.  PATIENT SURVEYS:  LEFS: 31/80  COGNITION: Overall cognitive status: Within functional limits for tasks assessed     SENSATION: Light touch: WFL  MUSCLE LENGTH: Hamstrings: Right 75 deg; Left 65 deg   POSTURE: rounded shoulders, forward head, increased thoracic kyphosis, flexed trunk , and weight shift left  PALPATION: No significant tenderness or pain to touch  LOWER EXTREMITY ROM:  Active ROM Right eval Left eval  Hip flexion 94 deg 92 deg  Hip extension    Hip abduction    Hip adduction    Hip internal rotation 18 deg 14 deg  Hip external rotation 24 deg 29 deg  Knee flexion 121 deg 119 deg  Knee extension -5 deg -1 deg  Ankle dorsiflexion    Ankle plantarflexion    Ankle inversion    Ankle eversion     (Blank rows = not tested)  LOWER EXTREMITY MMT:  MMT Right eval Left eval  Hip flexion -4 -4  Hip extension    Hip abduction 4- 4-  Hip adduction 4- 4  Hip internal rotation 4- 4-  Hip external rotation 4- 4-  Knee flexion 4- 4-  Knee extension 4- 4+  Ankle dorsiflexion NT 4+  Ankle plantarflexion    Ankle inversion    Ankle eversion     (Blank rows = not tested)  LOWER EXTREMITY SPECIAL TESTS:   FADDIR: Negative SLR: Negative   FUNCTIONAL TESTS:  5xSTS: 24.47 seconds (with bilateral UE support from chair arms)  GAIT: Distance walked: 40 ft around clinic Assistive device utilized: Single point cane and None Level of assistance: Modified independence Comments: Pt ambulated with SPC in RUE for support and was observed to have wide BOS with SPC and lateral trunk lean to left side. Pt ambulated with bilateral shortened step length, forward head posture and rounded shoulders, and decreased cadence. Pt ambulated with a step to gait pattern and decreased hip extension noted during toe off phase. Pt with significant right knee valgus noted during gait and during static stance.    Leg Length from umbilicus: symmetrical bilaterally Right leg length from ASIS: 103 cm (decreased right knee flexion and knee valgus present) Left leg length from ASIS: 100 cm  BERG: 43/50  FGA 10/30 (without  use of an assistive device) mCTSIB: significant sway with EO and EC on foam pad  03/16/2024 MMT Right eval Left eval  Hip flexion 4 4  Hip extension    Hip abduction 4- 4-  Hip adduction 4 4   5xSTS: 16.44 seconds with use of BUE support LEFs: 36 out of 80                                                                                                                               TREATMENT DATE: 04/26/2024    Subjective:  Pt. Had a nice Thanksgiving and trip to [place]  with family.  No pain or tenderness in R knee reported.    PT assessed R knee and ankle brace prior to walking in //-bars.     Therapeutic Activity:  Forward/ backwards/ lateral walking in //-bars 5x all directions.  Cuing for proper upright posture/ head position/ step pattern.  Sit to stands from gray chair height without use of bilateral UEs 6x (took 3 attempts with use of B UE/armrests prior to completing with no UE assist).  6 blue ball STS 4x (challenged)   Nustep L3 B UE/LE (seat position 9-10): 10 minutes with no rest  breaks.  Static standing on Airex pad with and without eyes closed, 2 x 30 seconds in //-bars for safety with CGA.  Posterior leaning requiring light UE assist.    Seated Marches and LAQs, 3#, 20x each  3# ankle wts: walking in clinic/ hallway (210 feet)- moderate cuing to increase hip flexion/ prevent shuffling.    Tandem stance (<10 sec. With L behind R).    Standing shoulder flexion/ abduction with upright posture 10x.  Discussed Yoga program.    Forward step ups to 6-inch stair, 2 x 10      PATIENT EDUCATION:  Education details: Anatomy involved, prognosis, POC, HEP, importance of continued participation in community activities such as walking and yoga Person educated: Patient Education method: Explanation, Demonstration, Tactile cues, Verbal cues, and Handouts Education comprehension: verbalized understanding and returned demonstration  HOME EXERCISE PROGRAM: Access Code: [email or web address] URL: [email or web address] Date: 02/21/2024 Prepared by: Ozell Sero Exercises - Standing Hip Extension with Counter Support - 1 x daily - 7 x weekly - 2 sets - 12 reps - Standing Hip Abduction with Counter Support - 1 x daily - 7 x weekly - 2 sets - 12 reps - Standing Hamstring Stretch with Step - 1 x daily - 7 x weekly - 3 sets - 10 reps - 20 sec. hold - Supine March - 1 x daily - 7 x weekly - 2 sets - 10 reps   ASSESSMENT:  CLINICAL IMPRESSION: Pt with improved ability to perform sit to stand transfers from gray chair without BUE assistance and able to complete STS with wt.  Pt continues to require frequent verbal and tactile cueing during ambulatory activities to promote upright posture and to avoid excessive forward flexion of upper trunk when  fatigued during gait in hallway.  Pt. Works hard during resisted ex. reports no significant increase in right knee or right ankle at end of tx session. Will continue to monitor and progress as able.   OBJECTIVE IMPAIRMENTS: Abnormal gait,  decreased activity tolerance, decreased balance, decreased endurance, decreased knowledge of use of DME, difficulty walking, decreased ROM, decreased strength, postural dysfunction, and pain.   ACTIVITY LIMITATIONS: carrying, lifting, standing, and transfers  PARTICIPATION LIMITATIONS: cleaning, laundry, and community activity  PERSONAL FACTORS: Age and 1-2 comorbidities: hypertension and chronic low back pain are also affecting patient's functional outcome.   REHAB POTENTIAL: Good  CLINICAL DECISION MAKING: Evolving/moderate complexity  EVALUATION COMPLEXITY: High   GOALS: Goals reviewed with patient? Yes  SHORT TERM GOALS: Target date: 04/18/2024 Pt to increase gross bilateral hip strength to at least a 4/5 in order to improve ability to stand for prolonged periods of time which is required for attending church services.  Baseline:see above. 10/23: 4/5 bilaterally and 4-/5 bilateral hip abduction Goal status: On-going  2.  Pt to experience no greater than 6/10 right knee pain at it's worst to improve ability to complete household tasks with greater ease.  Baseline: 10/10 at worst. 10/23: 6/10 Goal status: GOAL MET  LONG TERM GOALS: Target date: 05/02/2024  Pt to increase 5xSTS score to at least 15 seconds without the use of bilateral upper extremity support a to improve independence with functional transfers and decrease fall risk.  Baseline: 24.47 seconds with bilateral UE usage, (11/11): 16.44 seconds with BUE usage Goal status: Partially met  2.  Pt to increase LEFs score to at least 49/80 to improve self-confidence of functional abilities with daily activities.  Baseline: 31/80, (11/11): 36/80 Goal status: Partially met  3.  Pt to improve gait mechanics without significant valgus of right knee and an improved upright posture with the appropriately chosen assistive device so that he experiences greater ease with ambulating [place] distances with his wife.  Baseline:  see above (11/11): significant valgus of right knee with use of SPC in RUE  Goal status: On-going  PLAN:  PT FREQUENCY: 2x/week  PT DURATION: 4 weeks  PLANNED INTERVENTIONS: 97164- PT Re-evaluation, 97110-Therapeutic exercises, 97530- Therapeutic activity, 97112- Neuromuscular re-education, 97535- Self Care, 02859- Manual therapy, Patient/Family education, Balance training, Cryotherapy, and Moist heat  PLAN FOR NEXT SESSION: Continue gross LE strengthening for carryover for functional gait activities. Continue ambulatory activities with SPC and promote upright posture with all activities.    Ozell JAYSON Sero, PT, DPT # [id number] 04/26/2024, 5:59 PM

## 2024-05-01 ENCOUNTER — Ambulatory Visit: Admitting: Physical Therapy

## 2024-05-01 DIAGNOSIS — R2689 Other abnormalities of gait and mobility: Secondary | ICD-10-CM

## 2024-05-01 DIAGNOSIS — M2170 Unequal limb length (acquired), unspecified site: Secondary | ICD-10-CM

## 2024-05-01 DIAGNOSIS — M6281 Muscle weakness (generalized): Secondary | ICD-10-CM | POA: Diagnosis not present

## 2024-05-01 DIAGNOSIS — G8929 Other chronic pain: Secondary | ICD-10-CM

## 2024-05-01 DIAGNOSIS — M1710 Unilateral primary osteoarthritis, unspecified knee: Secondary | ICD-10-CM

## 2024-05-01 NOTE — Therapy (Signed)
 OUTPATIENT PHYSICAL THERAPY LOWER EXTREMITY TREATMENT  Patient Name: Taylor Mcbride MRN: [id number] DOB:February 11, 1946, 78 y.o., male Today's Date: 05/01/2024  END OF SESSION:   PT End of Session - 05/01/24 0850     Visit Number 16    Number of Visits 18    Date for Recertification  05/02/24    PT Start Time 0906    PT Stop Time 0947    PT Time Calculation (min) 41 min    Equipment Utilized During Treatment Gait belt    Activity Tolerance Patient tolerated treatment well;Patient limited by fatigue;Patient limited by pain    Behavior During Therapy [facility] for tasks assessed/performed            Past Medical History:  Diagnosis Date   Alcohol abuse, in remission    Hyperlipidemia    Hypertension    Spinal stenosis in cervical region    Past Surgical History:  Procedure Laterality Date   CARDIOVASCULAR STRESS TEST  2006   normal, during admission for chest pain,  [place]    COLONOSCOPY WITH PROPOFOL  N/A 05/08/2019   Procedure: COLONOSCOPY WITH PROPOFOL ;  Surgeon: Toledo, Ladell POUR, MD;  Location: [facility] ENDOSCOPY;  Service: Gastroenterology;  Laterality: N/A;   HERNIA REPAIR  1996   right inguinal hernia repair with mesh   SMALL INTESTINE SURGERY     cerivcal disk fusion   Patient Active Problem List   Diagnosis Date Noted   Hypertension 01/05/2012   Hyperlipidemia with target low density lipoprotein (LDL) cholesterol less than 100 mg/dL 91/86/7986   Arrhythmia 01/05/2012   Chronic back pain greater than 3 months duration 01/05/2012   Spinal stenosis in cervical region    Alcohol abuse, in remission    PCP: Delfina Pao, MD  REFERRING PROVIDER: Ewing, Lamar RIGGERS  REFERRING DIAG:  M17.11 (ICD-10-CM) - Unilateral primary osteoarthritis, right knee  M25.561 (ICD-10-CM) - Pain in right knee  G89.29 (ICD-10-CM) - Other chronic pain  R26.81 (ICD-10-CM) - Unsteadiness on feet   THERAPY DIAG:  Muscle weakness (generalized)  Chronic pain of right knee  Other  abnormalities of gait and mobility  Unilateral osteoarthritis of knee  Acquired leg length discrepancy  Rationale for Evaluation and Treatment: Rehabilitation  ONSET DATE: ~August 2025  SUBJECTIVE:   SUBJECTIVE STATEMENT: Pt is a 78 year old male who presents to physical therapy with concerns of chronic right knee pain and general lower extremity strength/endurance concerns affecting his functional independence. Pt has 0/10 right knee pain at rest but shooting pain can raise to a 10/10 with standing for long periods of time at church or walking for long periods of time in the community with his wife. Pt states that he feels fatigued with these activities and has noticed occasional instances of his right kne giving out. Pt reports that this is a chronic issue that has started irritating him more within the past two months. Pain is alleviated with rest and 1 week usage of right knee brace may be helping too, I'm not sure. Pt reports an allergy to NSAIDs so he is unable to take them for pain management at this time.   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 Pt reports having 4 metal rods placed in his right ankle which is why he has been wearing a brace over his right ankle for years. Pt has not used orthotic in right sneaker but does report that his left leg is longer than my right. Pt ambulating with SPC in  clinic on this day but states that this is new for him and that he has been walking at home without a device for the most part. Pt reports being independent at home and participates in yoga twice a week for general stretching/core strengthening.  PAIN:  Are you having pain? Yes: NPRS scale: 0/10 current, 10/10 at worst Pain location: General right knee pain Pain description: shooting Aggravating factors: Prolonged standing, prolonged walking, pivoting/turning at home, walking on outdoor surfaces Relieving factors: Rest  PRECAUTIONS:  None  RED FLAGS: None   WEIGHT BEARING RESTRICTIONS: No  FALLS:  Has patient fallen in last 6 months? Yes. Number of falls 1 (outdoors due to multitasking, assisted by family to get back up)  LIVING ENVIRONMENT: Lives with: lives with their spouse Lives in: House/apartment Stairs: No Has following equipment at home: Single point cane  OCCUPATION: Retired  PLOF: Independent  PATIENT GOALS: To decrease shooting pain in right knee and improve lower extremity strength so that he may return to outdoor walking with his wife and dog with greater ease  NEXT MD VISIT: None scheduled currently  OBJECTIVE:  Note: Objective measures were completed at Evaluation unless otherwise noted.  DIAGNOSTIC FINDINGS: N/A.  See care everywhere for [facility] records.  PATIENT SURVEYS:  LEFS: 31/80  COGNITION: Overall cognitive status: Within functional limits for tasks assessed     SENSATION: Light touch: WFL  MUSCLE LENGTH: Hamstrings: Right 75 deg; Left 65 deg   POSTURE: rounded shoulders, forward head, increased thoracic kyphosis, flexed trunk , and weight shift left  PALPATION: No significant tenderness or pain to touch  LOWER EXTREMITY ROM:  Active ROM Right eval Left eval  Hip flexion 94 deg 92 deg  Hip extension    Hip abduction    Hip adduction    Hip internal rotation 18 deg 14 deg  Hip external rotation 24 deg 29 deg  Knee flexion 121 deg 119 deg  Knee extension -5 deg -1 deg  Ankle dorsiflexion    Ankle plantarflexion    Ankle inversion    Ankle eversion     (Blank rows = not tested)  LOWER EXTREMITY MMT:  MMT Right eval Left eval  Hip flexion -4 -4  Hip extension    Hip abduction 4- 4-  Hip adduction 4- 4  Hip internal rotation 4- 4-  Hip external rotation 4- 4-  Knee flexion 4- 4-  Knee extension 4- 4+  Ankle dorsiflexion NT 4+  Ankle plantarflexion    Ankle inversion    Ankle eversion     (Blank rows = not tested)  LOWER EXTREMITY SPECIAL TESTS:   FADDIR: Negative SLR: Negative   FUNCTIONAL TESTS:  5xSTS: 24.47 seconds (with bilateral UE support from chair arms)  GAIT: Distance walked: 40 ft around clinic Assistive device utilized: Single point cane and None Level of assistance: Modified independence Comments: Pt ambulated with SPC in RUE for support and was observed to have wide BOS with SPC and lateral trunk lean to left side. Pt ambulated with bilateral shortened step length, forward head posture and rounded shoulders, and decreased cadence. Pt ambulated with a step to gait pattern and decreased hip extension noted during toe off phase. Pt with significant right knee valgus noted during gait and during static stance.    Leg Length from umbilicus: symmetrical bilaterally Right leg length from ASIS: 103 cm (decreased right knee flexion and knee valgus present) Left leg length from ASIS: 100 cm  BERG: 43/50  FGA 10/30 (without  use of an assistive device) mCTSIB: significant sway with EO and EC on foam pad  03/16/2024 MMT Right eval Left eval  Hip flexion 4 4  Hip extension    Hip abduction 4- 4-  Hip adduction 4 4   5xSTS: 16.44 seconds with use of BUE support LEFs: 36 out of 80                                                                                                                               TREATMENT DATE: 05/01/2024    Subjective:  Pt. Entered PT with use of SPC and R ankle brace with antalgic gait and decrease step pattern/ length.  Pt. C/o R knee pain but no subjective pain score given.  No recent falls reported.    Therapeutic Activity:   Nustep L3 B UE/LE (seat position 9-10): 10 minutes with no rest breaks.  Discussed weekend activities.    3# ankle wts.: seated marching/ LAQ/ 6 step touches and ups (UE assist on handrails for safety).    Forward/ lateral walking/ turning in //-bars with 3# ankle wts. 5x all directions.  Cuing for proper upright posture/ head position/ step pattern.  Walking  hallway with use of 3# ankle wts. And SPC with focus on consistent hip flexion/ step length.  Fatigue noted with marked decrease in R hip flexion/ step length.  Pt. Benefits from cuing to prevent shuffling gait on R LE.    Recip. Stair climbing with UE assist (4 stairs x 4).    Walking from gym to car outside at side of building.  SBA/CGA for safety secondary to marked LE muscle fatigue.  Pt. Required 1 short standing rest break to return to upright posture and prevent shuffling gait pattern.   PATIENT EDUCATION:  Education details: Anatomy involved, prognosis, POC, HEP, importance of continued participation in community activities such as walking and yoga Person educated: Patient Education method: Explanation, Demonstration, Tactile cues, Verbal cues, and Handouts Education comprehension: verbalized understanding and returned demonstration  HOME EXERCISE PROGRAM: Access Code: [email or web address] URL: [email or web address] Date: 02/21/2024 Prepared by: Ozell Sero Exercises - Standing Hip Extension with Counter Support - 1 x daily - 7 x weekly - 2 sets - 12 reps - Standing Hip Abduction with Counter Support - 1 x daily - 7 x weekly - 2 sets - 12 reps - Standing Hamstring Stretch with Step - 1 x daily - 7 x weekly - 3 sets - 10 reps - 20 sec. hold - Supine March - 1 x daily - 7 x weekly - 2 sets - 10 reps   ASSESSMENT:  CLINICAL IMPRESSION: Pt continues to require frequent verbal and tactile cueing during ambulatory activities to promote upright posture and to avoid excessive forward flexion of upper trunk when fatigued during gait in hallway.  Pt. Works hard during resisted ex. And reports no significant increase in right knee or right ankle at end of tx session.  Pt.  Did have 2 episodes of R knee to ankle pain symptoms while walking outside when placing foot down wrong.  Will continue to monitor and progress as able.   OBJECTIVE IMPAIRMENTS: Abnormal gait, decreased activity tolerance,  decreased balance, decreased endurance, decreased knowledge of use of DME, difficulty walking, decreased ROM, decreased strength, postural dysfunction, and pain.   ACTIVITY LIMITATIONS: carrying, lifting, standing, and transfers  PARTICIPATION LIMITATIONS: cleaning, laundry, and community activity  PERSONAL FACTORS: Age and 1-2 comorbidities: hypertension and chronic low back pain are also affecting patient's functional outcome.   REHAB POTENTIAL: Good  CLINICAL DECISION MAKING: Evolving/moderate complexity  EVALUATION COMPLEXITY: High   GOALS: Goals reviewed with patient? Yes  SHORT TERM GOALS: Target date: 04/18/2024 Pt to increase gross bilateral hip strength to at least a 4/5 in order to improve ability to stand for prolonged periods of time which is required for attending church services.  Baseline:see above. 10/23: 4/5 bilaterally and 4-/5 bilateral hip abduction Goal status: On-going  2.  Pt to experience no greater than 6/10 right knee pain at it's worst to improve ability to complete household tasks with greater ease.  Baseline: 10/10 at worst. 10/23: 6/10 Goal status: GOAL MET  LONG TERM GOALS: Target date: 05/02/2024  Pt to increase 5xSTS score to at least 15 seconds without the use of bilateral upper extremity support a to improve independence with functional transfers and decrease fall risk.  Baseline: 24.47 seconds with bilateral UE usage, (11/11): 16.44 seconds with BUE usage Goal status: Partially met  2.  Pt to increase LEFs score to at least 49/80 to improve self-confidence of functional abilities with daily activities.  Baseline: 31/80, (11/11): 36/80 Goal status: Partially met  3.  Pt to improve gait mechanics without significant valgus of right knee and an improved upright posture with the appropriately chosen assistive device so that he experiences greater ease with ambulating [place] distances with his wife.  Baseline: see above (11/11): significant  valgus of right knee with use of SPC in RUE  Goal status: On-going  PLAN:  PT FREQUENCY: 2x/week  PT DURATION: 4 weeks  PLANNED INTERVENTIONS: 97164- PT Re-evaluation, 97110-Therapeutic exercises, 97530- Therapeutic activity, 97112- Neuromuscular re-education, 97535- Self Care, 02859- Manual therapy, Patient/Family education, Balance training, Cryotherapy, and Moist heat  PLAN FOR NEXT SESSION: Continue gross LE strengthening for carryover for functional gait activities. RECERT next tx.  Ozell JAYSON Sero, PT, DPT # [phone] 05/01/2024, 12:52 PM

## 2024-05-03 ENCOUNTER — Ambulatory Visit: Admitting: Physical Therapy

## 2024-05-03 ENCOUNTER — Encounter: Payer: Self-pay | Admitting: Physical Therapy

## 2024-05-03 DIAGNOSIS — M6281 Muscle weakness (generalized): Secondary | ICD-10-CM | POA: Diagnosis not present

## 2024-05-03 DIAGNOSIS — G8929 Other chronic pain: Secondary | ICD-10-CM

## 2024-05-03 DIAGNOSIS — R2689 Other abnormalities of gait and mobility: Secondary | ICD-10-CM

## 2024-05-03 DIAGNOSIS — M1710 Unilateral primary osteoarthritis, unspecified knee: Secondary | ICD-10-CM

## 2024-05-03 DIAGNOSIS — M2170 Unequal limb length (acquired), unspecified site: Secondary | ICD-10-CM

## 2024-05-03 NOTE — Therapy (Signed)
 OUTPATIENT PHYSICAL THERAPY LOWER EXTREMITY TREATMENT/ RECERTIFICATION  Patient Name: Taylor Mcbride MRN: [id number] DOB:Jun 25, 1945, 78 y.o., male Today's Date: 05/03/2024  END OF SESSION:   PT End of Session - 05/03/24 0906     Visit Number 17 of 25   RECERT DATE: 05/31/24   PT Start Time 0857 to 0946   (49 minutes)   Equipment Utilized During Treatment Gait belt    Activity Tolerance Patient tolerated treatment well;Patient limited by fatigue;Patient limited by pain    Behavior During Therapy [facility] for tasks assessed/performed            Past Medical History:  Diagnosis Date   Alcohol abuse, in remission    Hyperlipidemia    Hypertension    Spinal stenosis in cervical region    Past Surgical History:  Procedure Laterality Date   CARDIOVASCULAR STRESS TEST  2006   normal, during admission for chest pain,  [place]    COLONOSCOPY WITH PROPOFOL  N/A 05/08/2019   Procedure: COLONOSCOPY WITH PROPOFOL ;  Surgeon: Toledo, Ladell POUR, MD;  Location: [facility] ENDOSCOPY;  Service: Gastroenterology;  Laterality: N/A;   HERNIA REPAIR  1996   right inguinal hernia repair with mesh   SMALL INTESTINE SURGERY     cerivcal disk fusion   Patient Active Problem List   Diagnosis Date Noted   Hypertension 01/05/2012   Hyperlipidemia with target low density lipoprotein (LDL) cholesterol less than 100 mg/dL 91/86/7986   Arrhythmia 01/05/2012   Chronic back pain greater than 3 months duration 01/05/2012   Spinal stenosis in cervical region    Alcohol abuse, in remission    PCP: Delfina Pao, MD  REFERRING PROVIDER: Ewing, Lamar RIGGERS  REFERRING DIAG:  M17.11 (ICD-10-CM) - Unilateral primary osteoarthritis, right knee  M25.561 (ICD-10-CM) - Pain in right knee  G89.29 (ICD-10-CM) - Other chronic pain  R26.81 (ICD-10-CM) - Unsteadiness on feet   THERAPY DIAG:  Muscle weakness (generalized)  Chronic pain of right knee  Other abnormalities of gait and mobility  Unilateral osteoarthritis of  knee  Acquired leg length discrepancy  Rationale for Evaluation and Treatment: Rehabilitation  ONSET DATE: ~August 2025  SUBJECTIVE:   SUBJECTIVE STATEMENT: Pt is a 78 year old male who presents to physical therapy with concerns of chronic right knee pain and general lower extremity strength/endurance concerns affecting his functional independence. Pt has 0/10 right knee pain at rest but shooting pain can raise to a 10/10 with standing for long periods of time at church or walking for long periods of time in the community with his wife. Pt states that he feels fatigued with these activities and has noticed occasional instances of his right kne giving out. Pt reports that this is a chronic issue that has started irritating him more within the past two months. Pain is alleviated with rest and 1 week usage of right knee brace may be helping too, I'm not sure. Pt reports an allergy to NSAIDs so he is unable to take them for pain management at this time.   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 Pt reports having 4 metal rods placed in his right ankle which is why he has been wearing a brace over his right ankle for years. Pt has not used orthotic in right sneaker but does report that his left leg is longer than my right. Pt ambulating with SPC in clinic on this day but states that this is new for him and that he has been walking at  home without a device for the most part. Pt reports being independent at home and participates in yoga twice a week for general stretching/core strengthening.  PAIN:  Are you having pain? Yes: NPRS scale: 0/10 current, 10/10 at worst Pain location: General right knee pain Pain description: shooting Aggravating factors: Prolonged standing, prolonged walking, pivoting/turning at home, walking on outdoor surfaces Relieving factors: Rest  PRECAUTIONS: None  RED FLAGS: None   WEIGHT BEARING RESTRICTIONS:  No  FALLS:  Has patient fallen in last 6 months? Yes. Number of falls 1 (outdoors due to multitasking, assisted by family to get back up)  LIVING ENVIRONMENT: Lives with: lives with their spouse Lives in: House/apartment Stairs: No Has following equipment at home: Single point cane  OCCUPATION: Retired  PLOF: Independent  PATIENT GOALS: To decrease shooting pain in right knee and improve lower extremity strength so that he may return to outdoor walking with his wife and dog with greater ease  NEXT MD VISIT: None scheduled currently  OBJECTIVE:  Note: Objective measures were completed at Evaluation unless otherwise noted.  DIAGNOSTIC FINDINGS: N/A.  See care everywhere for [facility] records.  PATIENT SURVEYS:  LEFS: 31/80  COGNITION: Overall cognitive status: Within functional limits for tasks assessed     SENSATION: Light touch: WFL  MUSCLE LENGTH: Hamstrings: Right 75 deg; Left 65 deg   POSTURE: rounded shoulders, forward head, increased thoracic kyphosis, flexed trunk , and weight shift left  PALPATION: No significant tenderness or pain to touch  LOWER EXTREMITY ROM:  Active ROM Right eval Left eval  Hip flexion 94 deg 92 deg  Hip extension    Hip abduction    Hip adduction    Hip internal rotation 18 deg 14 deg  Hip external rotation 24 deg 29 deg  Knee flexion 121 deg 119 deg  Knee extension -5 deg -1 deg  Ankle dorsiflexion    Ankle plantarflexion    Ankle inversion    Ankle eversion     (Blank rows = not tested)  LOWER EXTREMITY MMT:  MMT Right eval Left eval  Hip flexion -4 -4  Hip extension    Hip abduction 4- 4-  Hip adduction 4- 4  Hip internal rotation 4- 4-  Hip external rotation 4- 4-  Knee flexion 4- 4-  Knee extension 4- 4+  Ankle dorsiflexion NT 4+  Ankle plantarflexion    Ankle inversion    Ankle eversion     (Blank rows = not tested)  LOWER EXTREMITY SPECIAL TESTS:  FADDIR: Negative SLR: Negative   FUNCTIONAL TESTS:   5xSTS: 24.47 seconds (with bilateral UE support from chair arms)  GAIT: Distance walked: 40 ft around clinic Assistive device utilized: Single point cane and None Level of assistance: Modified independence Comments: Pt ambulated with SPC in RUE for support and was observed to have wide BOS with SPC and lateral trunk lean to left side. Pt ambulated with bilateral shortened step length, forward head posture and rounded shoulders, and decreased cadence. Pt ambulated with a step to gait pattern and decreased hip extension noted during toe off phase. Pt with significant right knee valgus noted during gait and during static stance.   Leg Length from umbilicus: symmetrical bilaterally Right leg length from ASIS: 103 cm (decreased right knee flexion and knee valgus present) Left leg length from ASIS: 100 cm  BERG: 43/50  FGA 10/30 (without use of an assistive device) mCTSIB: significant sway with EO and EC on foam pad  03/16/2024 MMT Right eval  Left eval  Hip flexion 4 4  Hip extension    Hip abduction 4- 4-  Hip adduction 4 4   5xSTS: 16.44 seconds with use of BUE support LEFs: 36 out of 80                                                                                                                               TREATMENT DATE: 05/03/2024    Subjective:  Pt. Entered PT with use of SPC and R ankle brace with no c/o R knee pain at this time.  Pt. Reports no recent falls at home.      Therapeutic Activity:   Nustep L4-3 B UE/LE (seat position 9-10): 10 minutes with no rest breaks.    Walking in hallway (2 laps) with use of SPC and cuing for proper BOS/ consistent step pattern/ heel strike.  Slight increase in R knee pain with increase distance walked.    3# ankle wts.: seated marching 20x/ LAQ 20x/ standing hip abduction 20x/ extension 20x/ 6 step touches and ups (UE assist on handrails for safety).    Forward/ lateral walking/ turning in //-bars with 3# ankle wts. 5x all  directions.  Cuing for proper upright posture/ head position/ step pattern.  Walking hallway with use of 3# ankle wts. And SPC with focus on consistent hip flexion/ step length.  Fatigue noted with marked decrease in R hip flexion/ step length.  Pt. Benefits from cuing to prevent shuffling gait on R LE.    Recip. Stair climbing with UE assist (4 stairs x 4).    STS from blue mat table (varying heights to challenge pt).    PATIENT EDUCATION:  Education details: Anatomy involved, prognosis, POC, HEP, importance of continued participation in community activities such as walking and yoga Person educated: Patient Education method: Explanation, Demonstration, Tactile cues, Verbal cues, and Handouts Education comprehension: verbalized understanding and returned demonstration  HOME EXERCISE PROGRAM: Access Code: [email or web address] URL: [email or web address] Date: 02/21/2024 Prepared by: Ozell Sero Exercises - Standing Hip Extension with Counter Support - 1 x daily - 7 x weekly - 2 sets - 12 reps - Standing Hip Abduction with Counter Support - 1 x daily - 7 x weekly - 2 sets - 12 reps - Standing Hamstring Stretch with Step - 1 x daily - 7 x weekly - 3 sets - 10 reps - 20 sec. hold - Supine March - 1 x daily - 7 x weekly - 2 sets - 10 reps   ASSESSMENT:  CLINICAL IMPRESSION: Pt continues to require frequent verbal and tactile cueing during ambulatory activities to promote upright posture and to avoid excessive forward flexion of upper trunk when fatigued during gait in hallway.  Pt. Works hard during resisted ex. And reports no significant increase in right knee or right ankle pain at end of tx session.  Pt. Will require short seated rest breaks during walking/ balance tasks secondary to fatigue.  Pt.  Will continue to benefit from PT to progress LE strengthening to improve balance/ safety with with walking/ daily activity.    OBJECTIVE IMPAIRMENTS: Abnormal gait, decreased activity tolerance,  decreased balance, decreased endurance, decreased knowledge of use of DME, difficulty walking, decreased ROM, decreased strength, postural dysfunction, and pain.   ACTIVITY LIMITATIONS: carrying, lifting, standing, and transfers  PARTICIPATION LIMITATIONS: cleaning, laundry, and community activity  PERSONAL FACTORS: Age and 1-2 comorbidities: hypertension and chronic low back pain are also affecting patient's functional outcome.   REHAB POTENTIAL: Good  CLINICAL DECISION MAKING: Evolving/moderate complexity  EVALUATION COMPLEXITY: High   GOALS: Goals reviewed with patient? Yes  SHORT TERM GOALS: Target date: 05/17/2024 Pt to increase gross bilateral hip strength to at least a 4/5 in order to improve ability to stand for prolonged periods of time which is required for attending church services.  Baseline:see above. 10/23: 4/5 bilaterally and 4-/5 bilateral hip abduction.  12/10: see above Goal status: Partially met  LONG TERM GOALS: Target date: 06/01/2023  Pt to increase 5xSTS score to at least 15 seconds without the use of bilateral upper extremity support a to improve independence with functional transfers and decrease fall risk.  Baseline: 24.47 seconds with bilateral UE usage, (11/11): 16.44 seconds with BUE usage Goal status: Partially met  2.  Pt to increase LEFs score to at least 49/80 to improve self-confidence of functional abilities with daily activities.  Baseline: 31/80, (11/11): 36/80 Goal status: Partially met  3.  Pt to improve gait mechanics without significant valgus of right knee and an improved upright posture with the appropriately chosen assistive device so that he experiences greater ease with ambulating [place] distances with his wife.  Baseline: see above (11/11): significant valgus of right knee with use of SPC in RUE  Goal status: On-going  PLAN:  PT FREQUENCY: 2x/week  PT DURATION: 4 weeks  PLANNED INTERVENTIONS: 97164- PT Re-evaluation,  97110-Therapeutic exercises, 97530- Therapeutic activity, 97112- Neuromuscular re-education, 97535- Self Care, 02859- Manual therapy, Patient/Family education, Balance training, Cryotherapy, and Moist heat  PLAN FOR NEXT SESSION: Continue gross LE strengthening for carryover for functional gait activities. Reassess LEFS/ STS  Ozell JAYSON Sero, PT, DPT # [phone] 05/03/2024, 9:07 AM

## 2024-05-08 ENCOUNTER — Encounter: Payer: Self-pay | Admitting: Physical Therapy

## 2024-05-08 ENCOUNTER — Ambulatory Visit: Admitting: Physical Therapy

## 2024-05-08 DIAGNOSIS — M6281 Muscle weakness (generalized): Secondary | ICD-10-CM | POA: Diagnosis not present

## 2024-05-08 DIAGNOSIS — G8929 Other chronic pain: Secondary | ICD-10-CM

## 2024-05-08 DIAGNOSIS — M1710 Unilateral primary osteoarthritis, unspecified knee: Secondary | ICD-10-CM

## 2024-05-08 DIAGNOSIS — R2689 Other abnormalities of gait and mobility: Secondary | ICD-10-CM

## 2024-05-08 DIAGNOSIS — M2170 Unequal limb length (acquired), unspecified site: Secondary | ICD-10-CM

## 2024-05-08 NOTE — Therapy (Signed)
 OUTPATIENT PHYSICAL THERAPY LOWER EXTREMITY TREATMENT  Patient Name: Taylor Mcbride MRN: [id number] DOB:[birth year], 78 y.o., male Today's Date: 05/08/2024  END OF SESSION:  PT End of Session - 05/08/24 0822     Visit Number 18    Number of Visits 25    Date for Recertification  05/31/24    PT Start Time 0815    PT Stop Time 0859    PT Time Calculation (min) 44 min    Equipment Utilized During Treatment Gait belt    Activity Tolerance Patient tolerated treatment well;Patient limited by fatigue;Patient limited by pain    Behavior During Therapy [facility] for tasks assessed/performed           Past Medical History:  Diagnosis Date   Alcohol abuse, in remission    Hyperlipidemia    Hypertension    Spinal stenosis in cervical region    Past Surgical History:  Procedure Laterality Date   CARDIOVASCULAR STRESS TEST  2006   normal, during admission for chest pain,  [place]    COLONOSCOPY WITH PROPOFOL  N/A 05/08/2019   Procedure: COLONOSCOPY WITH PROPOFOL ;  Surgeon: Toledo, Ladell POUR, MD;  Location: [facility] ENDOSCOPY;  Service: Gastroenterology;  Laterality: N/A;   HERNIA REPAIR  1996   right inguinal hernia repair with mesh   SMALL INTESTINE SURGERY     cerivcal disk fusion   Patient Active Problem List   Diagnosis Date Noted   Hypertension 01/05/2012   Hyperlipidemia with target low density lipoprotein (LDL) cholesterol less than 100 mg/dL 91/86/7986   Arrhythmia 01/05/2012   Chronic back pain greater than 3 months duration 01/05/2012   Spinal stenosis in cervical region    Alcohol abuse, in remission    PCP: Delfina Pao, MD  REFERRING PROVIDER: Ewing, Lamar RIGGERS  REFERRING DIAG:  M17.11 (ICD-10-CM) - Unilateral primary osteoarthritis, right knee  M25.561 (ICD-10-CM) - Pain in right knee  G89.29 (ICD-10-CM) - Other chronic pain  R26.81 (ICD-10-CM) - Unsteadiness on feet   THERAPY DIAG:  Muscle weakness (generalized)  Chronic pain of right knee  Other abnormalities  of gait and mobility  Unilateral osteoarthritis of knee  Acquired leg length discrepancy  Rationale for Evaluation and Treatment: Rehabilitation  ONSET DATE: ~August 2025  SUBJECTIVE:   SUBJECTIVE STATEMENT: Pt is a 78 year old male who presents to physical therapy with concerns of chronic right knee pain and general lower extremity strength/endurance concerns affecting his functional independence. Pt has 0/10 right knee pain at rest but shooting pain can raise to a 10/10 with standing for long periods of time at church or walking for long periods of time in the community with his wife. Pt states that he feels fatigued with these activities and has noticed occasional instances of his right kne giving out. Pt reports that this is a chronic issue that has started irritating him more within the past two months. Pain is alleviated with rest and 1 week usage of right knee brace may be helping too, I'm not sure. Pt reports an allergy to NSAIDs so he is unable to take them for pain management at this time.   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 Pt reports having 4 metal rods placed in his right ankle which is why he has been wearing a brace over his right ankle for years. Pt has not used orthotic in right sneaker but does report that his left leg is longer than my right. Pt ambulating with SPC in clinic on  this day but states that this is new for him and that he has been walking at home without a device for the most part. Pt reports being independent at home and participates in yoga twice a week for general stretching/core strengthening.  PAIN:  Are you having pain? Yes: NPRS scale: 0/10 current, 10/10 at worst Pain location: General right knee pain Pain description: shooting Aggravating factors: Prolonged standing, prolonged walking, pivoting/turning at home, walking on outdoor surfaces Relieving factors: Rest  PRECAUTIONS: None  RED  FLAGS: None   WEIGHT BEARING RESTRICTIONS: No  FALLS:  Has patient fallen in last 6 months? Yes. Number of falls 1 (outdoors due to multitasking, assisted by family to get back up)  LIVING ENVIRONMENT: Lives with: lives with their spouse Lives in: House/apartment Stairs: No Has following equipment at home: Single point cane  OCCUPATION: Retired  PLOF: Independent  PATIENT GOALS: To decrease shooting pain in right knee and improve lower extremity strength so that he may return to outdoor walking with his wife and dog with greater ease  NEXT MD VISIT: None scheduled currently  OBJECTIVE:  Note: Objective measures were completed at Evaluation unless otherwise noted.  DIAGNOSTIC FINDINGS: N/A.  See care everywhere for [facility] records.  PATIENT SURVEYS:  LEFS: 31/80  COGNITION: Overall cognitive status: Within functional limits for tasks assessed     SENSATION: Light touch: WFL  MUSCLE LENGTH: Hamstrings: Right 75 deg; Left 65 deg   POSTURE: rounded shoulders, forward head, increased thoracic kyphosis, flexed trunk , and weight shift left  PALPATION: No significant tenderness or pain to touch  LOWER EXTREMITY ROM:  Active ROM Right eval Left eval  Hip flexion 94 deg 92 deg  Hip extension    Hip abduction    Hip adduction    Hip internal rotation 18 deg 14 deg  Hip external rotation 24 deg 29 deg  Knee flexion 121 deg 119 deg  Knee extension -5 deg -1 deg  Ankle dorsiflexion    Ankle plantarflexion    Ankle inversion    Ankle eversion     (Blank rows = not tested)  LOWER EXTREMITY MMT:  MMT Right eval Left eval  Hip flexion -4 -4  Hip extension    Hip abduction 4- 4-  Hip adduction 4- 4  Hip internal rotation 4- 4-  Hip external rotation 4- 4-  Knee flexion 4- 4-  Knee extension 4- 4+  Ankle dorsiflexion NT 4+  Ankle plantarflexion    Ankle inversion    Ankle eversion     (Blank rows = not tested)  LOWER EXTREMITY SPECIAL TESTS:  FADDIR:  Negative SLR: Negative   FUNCTIONAL TESTS:  5xSTS: 24.47 seconds (with bilateral UE support from chair arms)  GAIT: Distance walked: 40 ft around clinic Assistive device utilized: Single point cane and None Level of assistance: Modified independence Comments: Pt ambulated with SPC in RUE for support and was observed to have wide BOS with SPC and lateral trunk lean to left side. Pt ambulated with bilateral shortened step length, forward head posture and rounded shoulders, and decreased cadence. Pt ambulated with a step to gait pattern and decreased hip extension noted during toe off phase. Pt with significant right knee valgus noted during gait and during static stance.   Leg Length from umbilicus: symmetrical bilaterally Right leg length from ASIS: 103 cm (decreased right knee flexion and knee valgus present) Left leg length from ASIS: 100 cm  BERG: 43/50  FGA 10/30 (without use of an  assistive device) mCTSIB: significant sway with EO and EC on foam pad  03/16/2024 MMT Right eval Left eval  Hip flexion 4 4  Hip extension    Hip abduction 4- 4-  Hip adduction 4 4   5xSTS: 16.44 seconds with use of BUE support LEFs: 36 out of 80                                                                                                                               TREATMENT DATE: 05/08/2024    Subjective:  Pt. Entered PT with use of SPC and R ankle brace with no c/o R knee pain at this time.  Pt. Reports no recent falls at home.      LEFS:  43 out of 80 5xSTS: 22.95 sec.  (Took 2 practice attempts to complete proper sit to stand with pushing on arm rest).    Therapeutic Activity:   Nustep L4 B UE/LE (seat position 9-10): 10 minutes with no rest breaks.    Walking in hallway (2 laps) with use of SPC and cuing for proper BOS/ consistent step pattern/ heel strike.  Slight increase in R knee pain with increase distance walked.    3# ankle wts.: seated marching 20x/ LAQ 20x/ standing hip  abduction 20x/ extension 20x/ 6 and 12 step touches (UE assist on handrails for safety).    Forward/ lateral walking/ turning in //-bars with 3# ankle wts. 5x all directions.  Cuing for proper upright posture/ head position/ step pattern.  Walking hallway with use of 3# ankle wts. And SPC with focus on consistent hip flexion/ step length.  Fatigue noted with marked decrease in R hip flexion/ step length.  Pt. Benefits from cuing to prevent shuffling gait on R LE.    Recip. Stair climbing with UE assist (4 stairs x 4).     PATIENT EDUCATION:  Education details: Anatomy involved, prognosis, POC, HEP, importance of continued participation in community activities such as walking and yoga Person educated: Patient Education method: Explanation, Demonstration, Tactile cues, Verbal cues, and Handouts Education comprehension: verbalized understanding and returned demonstration  HOME EXERCISE PROGRAM: Access Code: [email or web address] URL: [email or web address] Date: 02/21/2024 Prepared by: Ozell Sero Exercises - Standing Hip Extension with Counter Support - 1 x daily - 7 x weekly - 2 sets - 12 reps - Standing Hip Abduction with Counter Support - 1 x daily - 7 x weekly - 2 sets - 12 reps - Standing Hamstring Stretch with Step - 1 x daily - 7 x weekly - 3 sets - 10 reps - 20 sec. hold - Supine March - 1 x daily - 7 x weekly - 2 sets - 10 reps   ASSESSMENT:  CLINICAL IMPRESSION: Discussed continuing PT into January secondary to good progress being made over past month.  Pt continues to require frequent verbal and tactile cueing during ambulatory activities to promote upright posture and to avoid excessive forward flexion of upper  trunk when fatigued during gait in hallway.  Pt. Works hard during resisted ex. And reports no significant increase in right knee or right ankle pain at end of tx session.  Pt. Will require short seated rest breaks during walking/ balance tasks secondary to fatigue.  Pt. Will  continue to benefit from PT to progress LE strengthening to improve balance/ safety with with walking/ daily activity.    OBJECTIVE IMPAIRMENTS: Abnormal gait, decreased activity tolerance, decreased balance, decreased endurance, decreased knowledge of use of DME, difficulty walking, decreased ROM, decreased strength, postural dysfunction, and pain.   ACTIVITY LIMITATIONS: carrying, lifting, standing, and transfers  PARTICIPATION LIMITATIONS: cleaning, laundry, and community activity  PERSONAL FACTORS: Age and 1-2 comorbidities: hypertension and chronic low back pain are also affecting patient's functional outcome.   REHAB POTENTIAL: Good  CLINICAL DECISION MAKING: Evolving/moderate complexity  EVALUATION COMPLEXITY: High   GOALS: Goals reviewed with patient? Yes  SHORT TERM GOALS: Target date: 05/17/2024 Pt to increase gross bilateral hip strength to at least a 4/5 in order to improve ability to stand for prolonged periods of time which is required for attending church services.  Baseline:see above. 10/23: 4/5 bilaterally and 4-/5 bilateral hip abduction.  12/10: see above Goal status: Partially met  LONG TERM GOALS: Target date: 06/01/2023  Pt to increase 5xSTS score to at least 15 seconds without the use of bilateral upper extremity support a to improve independence with functional transfers and decrease fall risk.  Baseline: 24.47 seconds with bilateral UE usage, (11/11): 16.44 seconds with BUE usage.  12/15: 22.95 sec.  (Took 2 practice attempts to complete proper sit to stand with pushing on arm rest).  No UE assist Goal status: Partially met  2.  Pt to increase LEFs score to at least 49/80 to improve self-confidence of functional abilities with daily activities.  Baseline: 31/80, (11/11): 36/80.  12/15: 43 out of 80 Goal status: Partially met  3.  Pt to improve gait mechanics without significant valgus of right knee and an improved upright posture with the appropriately chosen  assistive device so that he experiences greater ease with ambulating  distances with his wife.  Baseline: see above (11/11): significant valgus of right knee with use of SPC in RUE  Goal status: On-going  PLAN:  PT FREQUENCY: 2x/week  PT DURATION: 4 weeks  PLANNED INTERVENTIONS: 97164- PT Re-evaluation, 97110-Therapeutic exercises, 97530- Therapeutic activity, 97112- Neuromuscular re-education, 97535- Self Care, [id number]- Manual therapy, Patient/Family education, Balance training, Cryotherapy, and Moist heat  PLAN FOR NEXT SESSION: Continue gross LE strengthening for carryover for functional gait activities.   Ozell JAYSON Sero, PT, DPT # [phone] 05/08/2024, 6:05 PM

## 2024-05-10 ENCOUNTER — Ambulatory Visit: Admitting: Physical Therapy

## 2024-05-11 ENCOUNTER — Ambulatory Visit: Admitting: Physical Therapy

## 2024-05-15 ENCOUNTER — Encounter: Payer: Self-pay | Admitting: Physical Therapy

## 2024-05-15 ENCOUNTER — Ambulatory Visit: Admitting: Physical Therapy

## 2024-05-15 DIAGNOSIS — M1710 Unilateral primary osteoarthritis, unspecified knee: Secondary | ICD-10-CM

## 2024-05-15 DIAGNOSIS — G8929 Other chronic pain: Secondary | ICD-10-CM

## 2024-05-15 DIAGNOSIS — M6281 Muscle weakness (generalized): Secondary | ICD-10-CM

## 2024-05-15 DIAGNOSIS — M2170 Unequal limb length (acquired), unspecified site: Secondary | ICD-10-CM

## 2024-05-15 DIAGNOSIS — R2689 Other abnormalities of gait and mobility: Secondary | ICD-10-CM

## 2024-05-15 NOTE — Therapy (Signed)
 " OUTPATIENT PHYSICAL THERAPY LOWER EXTREMITY TREATMENT  Patient Name: Taylor Mcbride MRN: [id number] DOB:Aug 23, 1945, 78 y.o., male Today's Date: 05/15/2024  END OF SESSION:  PT End of Session - 05/15/24 0903     Visit Number 19    Number of Visits 25    Date for Recertification  05/31/24    PT Start Time 0904    PT Stop Time 0947    PT Time Calculation (min) 43 min    Equipment Utilized During Treatment Gait belt    Activity Tolerance Patient tolerated treatment well;Patient limited by fatigue;Patient limited by pain    Behavior During Therapy [facility] for tasks assessed/performed           Past Medical History:  Diagnosis Date   Alcohol abuse, in remission    Hyperlipidemia    Hypertension    Spinal stenosis in cervical region    Past Surgical History:  Procedure Laterality Date   CARDIOVASCULAR STRESS TEST  2006   normal, during admission for chest pain,  [place]    COLONOSCOPY WITH PROPOFOL  N/A 05/08/2019   Procedure: COLONOSCOPY WITH PROPOFOL ;  Surgeon: Toledo, Ladell POUR, MD;  Location: [facility] ENDOSCOPY;  Service: Gastroenterology;  Laterality: N/A;   HERNIA REPAIR  1996   right inguinal hernia repair with mesh   SMALL INTESTINE SURGERY     cerivcal disk fusion   Patient Active Problem List   Diagnosis Date Noted   Hypertension 01/05/2012   Hyperlipidemia with target low density lipoprotein (LDL) cholesterol less than 100 mg/dL 91/86/7986   Arrhythmia 01/05/2012   Chronic back pain greater than 3 months duration 01/05/2012   Spinal stenosis in cervical region    Alcohol abuse, in remission    PCP: Delfina Pao, MD  REFERRING PROVIDER: Ewing, Lamar RIGGERS  REFERRING DIAG:  M17.11 (ICD-10-CM) - Unilateral primary osteoarthritis, right knee  M25.561 (ICD-10-CM) - Pain in right knee  G89.29 (ICD-10-CM) - Other chronic pain  R26.81 (ICD-10-CM) - Unsteadiness on feet   THERAPY DIAG:  Muscle weakness (generalized)  Chronic pain of right knee  Other abnormalities  of gait and mobility  Unilateral osteoarthritis of knee  Acquired leg length discrepancy  Rationale for Evaluation and Treatment: Rehabilitation  ONSET DATE: ~August 2025  SUBJECTIVE:   SUBJECTIVE STATEMENT: Pt is a 78 year old male who presents to physical therapy with concerns of chronic right knee pain and general lower extremity strength/endurance concerns affecting his functional independence. Pt has 0/10 right knee pain at rest but shooting pain can raise to a 10/10 with standing for long periods of time at church or walking for long periods of time in the community with his wife. Pt states that he feels fatigued with these activities and has noticed occasional instances of his right kne giving out. Pt reports that this is a chronic issue that has started irritating him more within the past two months. Pain is alleviated with rest and 1 week usage of right knee brace may be helping too, I'm not sure. Pt reports an allergy to NSAIDs so he is unable to take them for pain management at this time.   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 Pt reports having 4 metal rods placed in his right ankle which is why he has been wearing a brace over his right ankle for years. Pt has not used orthotic in right sneaker but does report that his left leg is longer than my right. Pt ambulating with SPC in clinic  on this day but states that this is new for him and that he has been walking at home without a device for the most part. Pt reports being independent at home and participates in yoga twice a week for general stretching/core strengthening.  PAIN:  Are you having pain? Yes: NPRS scale: 0/10 current, 10/10 at worst Pain location: General right knee pain Pain description: shooting Aggravating factors: Prolonged standing, prolonged walking, pivoting/turning at home, walking on outdoor surfaces Relieving factors: Rest  PRECAUTIONS: None  RED  FLAGS: None   WEIGHT BEARING RESTRICTIONS: No  FALLS:  Has patient fallen in last 6 months? Yes. Number of falls 1 (outdoors due to multitasking, assisted by family to get back up)  LIVING ENVIRONMENT: Lives with: lives with their spouse Lives in: House/apartment Stairs: No Has following equipment at home: Single point cane  OCCUPATION: Retired  PLOF: Independent  PATIENT GOALS: To decrease shooting pain in right knee and improve lower extremity strength so that he may return to outdoor walking with his wife and dog with greater ease  NEXT MD VISIT: None scheduled currently  OBJECTIVE:  Note: Objective measures were completed at Evaluation unless otherwise noted.  DIAGNOSTIC FINDINGS: N/A.  See care everywhere for [facility] records.  PATIENT SURVEYS:  LEFS: 31/80  COGNITION: Overall cognitive status: Within functional limits for tasks assessed     SENSATION: Light touch: WFL  MUSCLE LENGTH: Hamstrings: Right 75 deg; Left 65 deg   POSTURE: rounded shoulders, forward head, increased thoracic kyphosis, flexed trunk , and weight shift left  PALPATION: No significant tenderness or pain to touch  LOWER EXTREMITY ROM:  Active ROM Right eval Left eval  Hip flexion 94 deg 92 deg  Hip extension    Hip abduction    Hip adduction    Hip internal rotation 18 deg 14 deg  Hip external rotation 24 deg 29 deg  Knee flexion 121 deg 119 deg  Knee extension -5 deg -1 deg  Ankle dorsiflexion    Ankle plantarflexion    Ankle inversion    Ankle eversion     (Blank rows = not tested)  LOWER EXTREMITY MMT:  MMT Right eval Left eval  Hip flexion -4 -4  Hip extension    Hip abduction 4- 4-  Hip adduction 4- 4  Hip internal rotation 4- 4-  Hip external rotation 4- 4-  Knee flexion 4- 4-  Knee extension 4- 4+  Ankle dorsiflexion NT 4+  Ankle plantarflexion    Ankle inversion    Ankle eversion     (Blank rows = not tested)  LOWER EXTREMITY SPECIAL TESTS:  FADDIR:  Negative SLR: Negative   FUNCTIONAL TESTS:  5xSTS: 24.47 seconds (with bilateral UE support from chair arms)  GAIT: Distance walked: 40 ft around clinic Assistive device utilized: Single point cane and None Level of assistance: Modified independence Comments: Pt ambulated with SPC in RUE for support and was observed to have wide BOS with SPC and lateral trunk lean to left side. Pt ambulated with bilateral shortened step length, forward head posture and rounded shoulders, and decreased cadence. Pt ambulated with a step to gait pattern and decreased hip extension noted during toe off phase. Pt with significant right knee valgus noted during gait and during static stance.   Leg Length from umbilicus: symmetrical bilaterally Right leg length from ASIS: 103 cm (decreased right knee flexion and knee valgus present) Left leg length from ASIS: 100 cm  BERG: 43/50  FGA 10/30 (without use of  an assistive device) mCTSIB: significant sway with EO and EC on foam pad  03/16/2024 MMT Right eval Left eval  Hip flexion 4 4  Hip extension    Hip abduction 4- 4-  Hip adduction 4 4   5xSTS: 16.44 seconds with use of BUE support LEFs: 36 out of 80      LEFS:  43 out of 80 5xSTS: 22.95 sec.  (Took 2 practice attempts to complete proper sit to stand with pushing on arm rest).                                                                                                                             TREATMENT DATE: 05/15/2024    Subjective:  Pt. Entered PT with use of SPC and R ankle brace with no c/o R knee pain at this time.  Pt. Reports no recent falls at home.  Pts. Wife discussed pts. Status with PT and attending church service yesterday.     Therapeutic Activity:   Nustep L4 B UE/LE (seat position 9-10): 10 minutes with no rest breaks.    4# ankle wts.: seated marching 20x/ LAQ 20x/ standing hip abduction 20x/ extension 20x/ 6 and 12 step touches (UE assist on handrails for safety).     Walking in hallway (2 laps) with use of SPC while wearing 4# ankle wts. and cuing for proper BOS/ consistent step pattern/ heel strike.  No increase c/o knee pain but fatigue noted.    Recip. Stair climbing with UE assist (4 stairs x 4).     Sit to stand on edge of blue mat table.  Pt. Pushes into posterior posture and benefits from light tactile cuing to improve upright posture.    PATIENT EDUCATION:  Education details: Anatomy involved, prognosis, POC, HEP, importance of continued participation in community activities such as walking and yoga Person educated: Patient Education method: Explanation, Demonstration, Tactile cues, Verbal cues, and Handouts Education comprehension: verbalized understanding and returned demonstration  HOME EXERCISE PROGRAM: Access Code: [email or web address] URL: [email or web address] Date: 02/21/2024 Prepared by: Ozell Sero Exercises - Standing Hip Extension with Counter Support - 1 x daily - 7 x weekly - 2 sets - 12 reps - Standing Hip Abduction with Counter Support - 1 x daily - 7 x weekly - 2 sets - 12 reps - Standing Hamstring Stretch with Step - 1 x daily - 7 x weekly - 3 sets - 10 reps - 20 sec. hold - Supine March - 1 x daily - 7 x weekly - 2 sets - 10 reps   ASSESSMENT:  CLINICAL IMPRESSION: Pt continues to require frequent verbal and tactile cueing during ambulatory activities to promote upright posture and to avoid excessive forward flexion of upper trunk when fatigued during gait in hallway.  Pt. Works hard during resisted ex. And reports no significant increase in right knee or right ankle pain at end of tx session.   Moderate posterior lean with sit to stands  from blue mat table in low position but able to correct with tactile/ verbal cuing.  Pt. Will require short seated rest breaks during walking/ balance tasks secondary to fatigue.  Pt. Will continue to benefit from PT to progress LE strengthening to improve balance/ safety with with walking/  daily activity.    OBJECTIVE IMPAIRMENTS: Abnormal gait, decreased activity tolerance, decreased balance, decreased endurance, decreased knowledge of use of DME, difficulty walking, decreased ROM, decreased strength, postural dysfunction, and pain.   ACTIVITY LIMITATIONS: carrying, lifting, standing, and transfers  PARTICIPATION LIMITATIONS: cleaning, laundry, and community activity  PERSONAL FACTORS: Age and 1-2 comorbidities: hypertension and chronic low back pain are also affecting patient's functional outcome.   REHAB POTENTIAL: Good  CLINICAL DECISION MAKING: Evolving/moderate complexity  EVALUATION COMPLEXITY: High   GOALS: Goals reviewed with patient? Yes  SHORT TERM GOALS: Target date: 05/17/2024 Pt to increase gross bilateral hip strength to at least a 4/5 in order to improve ability to stand for prolonged periods of time which is required for attending church services.  Baseline:see above. 10/23: 4/5 bilaterally and 4-/5 bilateral hip abduction.  12/10: see above Goal status: Partially met  LONG TERM GOALS: Target date: 06/01/2023  Pt to increase 5xSTS score to at least 15 seconds without the use of bilateral upper extremity support a to improve independence with functional transfers and decrease fall risk.  Baseline: 24.47 seconds with bilateral UE usage, (11/11): 16.44 seconds with BUE usage.  12/15: 22.95 sec.  (Took 2 practice attempts to complete proper sit to stand with pushing on arm rest).  No UE assist Goal status: Partially met  2.  Pt to increase LEFs score to at least 49/80 to improve self-confidence of functional abilities with daily activities.  Baseline: 31/80, (11/11): 36/80.  12/15: 43 out of 80 Goal status: Partially met  3.  Pt to improve gait mechanics without significant valgus of right knee and an improved upright posture with the appropriately chosen assistive device so that he experiences greater ease with ambulating [place] distances with his  wife.  Baseline: see above (11/11): significant valgus of right knee with use of SPC in RUE  Goal status: On-going  PLAN:  PT FREQUENCY: 2x/week  PT DURATION: 4 weeks  PLANNED INTERVENTIONS: 97164- PT Re-evaluation, 97110-Therapeutic exercises, 97530- Therapeutic activity, 97112- Neuromuscular re-education, 97535- Self Care, [id number]- Manual therapy, Patient/Family education, Balance training, Cryotherapy, and Moist heat  PLAN FOR NEXT SESSION: Continue gross LE strengthening for carryover for functional gait activities.   Ozell JAYSON Sero, PT, DPT # [phone] 05/15/2024, 7:35 PM  "

## 2024-05-22 ENCOUNTER — Encounter: Payer: Self-pay | Admitting: Physical Therapy

## 2024-05-22 ENCOUNTER — Ambulatory Visit: Admitting: Physical Therapy

## 2024-05-22 DIAGNOSIS — R2689 Other abnormalities of gait and mobility: Secondary | ICD-10-CM

## 2024-05-22 DIAGNOSIS — M6281 Muscle weakness (generalized): Secondary | ICD-10-CM | POA: Diagnosis not present

## 2024-05-22 DIAGNOSIS — M1710 Unilateral primary osteoarthritis, unspecified knee: Secondary | ICD-10-CM

## 2024-05-22 DIAGNOSIS — M2170 Unequal limb length (acquired), unspecified site: Secondary | ICD-10-CM

## 2024-05-22 DIAGNOSIS — G8929 Other chronic pain: Secondary | ICD-10-CM

## 2024-05-22 NOTE — Therapy (Signed)
 " OUTPATIENT PHYSICAL THERAPY LOWER EXTREMITY TREATMENT Physical Therapy Progress Note  Dates of reporting period  04/06/24   to   05/22/24   Patient Name: Taylor Mcbride MRN: [id number] DOB:July 04, 1945, 78 y.o., male Today's Date: 05/22/2024  END OF SESSION:  PT End of Session - 05/22/24 0902     Visit Number 20    Number of Visits 25    Date for Recertification  05/31/24    PT Start Time 0857    PT Stop Time 0944    PT Time Calculation (min) 47 min    Equipment Utilized During Treatment Gait belt    Activity Tolerance Patient tolerated treatment well;Patient limited by fatigue;Patient limited by pain    Behavior During Therapy [facility] for tasks assessed/performed          Past Medical History:  Diagnosis Date   Alcohol abuse, in remission    Hyperlipidemia    Hypertension    Spinal stenosis in cervical region    Past Surgical History:  Procedure Laterality Date   CARDIOVASCULAR STRESS TEST  2006   normal, during admission for chest pain,  [place]    COLONOSCOPY WITH PROPOFOL  N/A 05/08/2019   Procedure: COLONOSCOPY WITH PROPOFOL ;  Surgeon: Toledo, Ladell POUR, MD;  Location: [facility] END[facility]COPY;  Service: Gastroenterology;  Laterality: N/A;   HERNIA REPAIR  1996   right inguinal hernia repair with mesh   SMALL INTESTINE SURGERY     cerivcal disk fusion   Patient Active Problem List   Diagnosis Date Noted   Hypertension 01/05/2012   Hyperlipidemia with target low density lipoprotein (LDL) cholesterol less than 100 mg/dL 91/86/7986   Arrhythmia 01/05/2012   Chronic back pain greater than 3 months duration 01/05/2012   Spinal stenosis in cervical region    Alcohol abuse, in remission    PCP: Delfina Pao, MD  REFERRING PROVIDER: Ewing, Lamar RIGGERS  REFERRING DIAG:  M17.11 (ICD-10-CM) - Unilateral primary osteoarthritis, right knee  M25.561 (ICD-10-CM) - Pain in right knee  G89.29 (ICD-10-CM) - Other chronic pain  R26.81 (ICD-10-CM) - Unsteadiness on feet   THERAPY  DIAG:  Muscle weakness (generalized)  Chronic pain of right knee  Other abnormalities of gait and mobility  Unilateral osteoarthritis of knee  Acquired leg length discrepancy  Rationale for Evaluation and Treatment: Rehabilitation  ONSET DATE: ~August 2025  SUBJECTIVE:   SUBJECTIVE STATEMENT: Pt is a 78 year old male who presents to physical therapy with concerns of chronic right knee pain and general lower extremity strength/endurance concerns affecting his functional independence. Pt has 0/10 right knee pain at rest but shooting pain can raise to a 10/10 with standing for long periods of time at church or walking for long periods of time in the community with his wife. Pt states that he feels fatigued with these activities and has noticed occasional instances of his right kne giving out. Pt reports that this is a chronic issue that has started irritating him more within the past two months. Pain is alleviated with rest and 1 week usage of right knee brace may be helping too, I'm not sure. Pt reports an allergy to NSAIDs so he is unable to take them for pain management at this time.   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 Pt reports having 4 metal rods placed in his right ankle which is why he has been wearing a brace over his right ankle for years. Pt has not used orthotic in right sneaker but  does report that his left leg is longer than my right. Pt ambulating with SPC in clinic on this day but states that this is new for him and that he has been walking at home without a device for the most part. Pt reports being independent at home and participates in yoga twice a week for general stretching/core strengthening.  PAIN:  Are you having pain? Yes: NPRS scale: 0/10 current, 10/10 at worst Pain location: General right knee pain Pain description: shooting Aggravating factors: Prolonged standing, prolonged walking, pivoting/turning at  home, walking on outdoor surfaces Relieving factors: Rest  PRECAUTIONS: None  RED FLAGS: None   WEIGHT BEARING RESTRICTIONS: No  FALLS:  Has patient fallen in last 6 months? Yes. Number of falls 1 (outdoors due to multitasking, assisted by family to get back up)  LIVING ENVIRONMENT: Lives with: lives with their spouse Lives in: House/apartment Stairs: No Has following equipment at home: Single point cane  OCCUPATION: Retired  PLOF: Independent  PATIENT GOALS: To decrease shooting pain in right knee and improve lower extremity strength so that he may return to outdoor walking with his wife and dog with greater ease  NEXT MD VISIT: None scheduled currently  OBJECTIVE:  Note: Objective measures were completed at Evaluation unless otherwise noted.  DIAGNOSTIC FINDINGS: N/A.  See care everywhere for [facility] records.  PATIENT SURVEYS:  LEFS: 31/80  COGNITION: Overall cognitive status: Within functional limits for tasks assessed     SENSATION: Light touch: WFL  MUSCLE LENGTH: Hamstrings: Right 75 deg; Left 65 deg   POSTURE: rounded shoulders, forward head, increased thoracic kyphosis, flexed trunk , and weight shift left  PALPATION: No significant tenderness or pain to touch  LOWER EXTREMITY ROM:  Active ROM Right eval Left eval  Hip flexion 94 deg 92 deg  Hip extension    Hip abduction    Hip adduction    Hip internal rotation 18 deg 14 deg  Hip external rotation 24 deg 29 deg  Knee flexion 121 deg 119 deg  Knee extension -5 deg -1 deg  Ankle dorsiflexion    Ankle plantarflexion    Ankle inversion    Ankle eversion     (Blank rows = not tested)  LOWER EXTREMITY MMT:  MMT Right eval Left eval  Hip flexion -4 -4  Hip extension    Hip abduction 4- 4-  Hip adduction 4- 4  Hip internal rotation 4- 4-  Hip external rotation 4- 4-  Knee flexion 4- 4-  Knee extension 4- 4+  Ankle dorsiflexion NT 4+  Ankle plantarflexion    Ankle inversion    Ankle  eversion     (Blank rows = not tested)  LOWER EXTREMITY SPECIAL TESTS:  FADDIR: Negative SLR: Negative   FUNCTIONAL TESTS:  5xSTS: 24.47 seconds (with bilateral UE support from chair arms)  GAIT: Distance walked: 40 ft around clinic Assistive device utilized: Single point cane and None Level of assistance: Modified independence Comments: Pt ambulated with SPC in RUE for support and was observed to have wide BOS with SPC and lateral trunk lean to left side. Pt ambulated with bilateral shortened step length, forward head posture and rounded shoulders, and decreased cadence. Pt ambulated with a step to gait pattern and decreased hip extension noted during toe off phase. Pt with significant right knee valgus noted during gait and during static stance.   Leg Length from umbilicus: symmetrical bilaterally Right leg length from ASIS: 103 cm (decreased right knee flexion and knee valgus  present) Left leg length from ASIS: 100 cm  BERG: 43/50  FGA 10/30 (without use of an assistive device) mCTSIB: significant sway with EO and EC on foam pad  03/16/2024 MMT Right eval Left eval  Hip flexion 4 4  Hip extension    Hip abduction 4- 4-  Hip adduction 4 4   5xSTS: 16.44 seconds with use of BUE support LEFs: 36 out of 80      LEFS:  43 out of 80 5xSTS: 22.95 sec.  (Took 2 practice attempts to complete proper sit to stand with pushing on arm rest).                                                                                                                             TREATMENT DATE: 05/22/2024    Subjective:  Pt. Entered PT with use of SPC and R ankle brace with no c/o R knee pain at this time.  Pt. Reports no recent falls at home.   Discussed walking at grocery store while pushing shopping cart with wife.    Therapeutic Activity:   Nustep L4 B UE/LE (seat position 11): 10 minutes with no rest breaks.    5# ankle wts.: seated marching 20x/ LAQ 20x/ walking marching 4 laps/ lateral  walking 3 laps/ extension 20x/ 6 and 12 step touches (UE assist on handrails for safety).    Walking in hallway (2 laps) with use of SPC while wearing 5# ankle wts. and cuing for proper BOS/ consistent step pattern/ heel strike.  No increase c/o knee pain but fatigue noted.    Recip. Stair climbing with UE assist (4 stairs x 4).    Sit to stands from gray chair with cuing for technique/ upright posture.     Reviewed HEP/ discussed continuing PT tx. Session.  PATIENT EDUCATION:  Education details: Anatomy involved, prognosis, POC, HEP, importance of continued participation in community activities such as walking and yoga Person educated: Patient Education method: Explanation, Demonstration, Tactile cues, Verbal cues, and Handouts Education comprehension: verbalized understanding and returned demonstration  HOME EXERCISE PROGRAM: Access Code: [email or web address] URL: [email or web address] Date: 02/21/2024 Prepared by: Ozell Sero Exercises - Standing Hip Extension with Counter Support - 1 x daily - 7 x weekly - 2 sets - 12 reps - Standing Hip Abduction with Counter Support - 1 x daily - 7 x weekly - 2 sets - 12 reps - Standing Hamstring Stretch with Step - 1 x daily - 7 x weekly - 3 sets - 10 reps - 20 sec. hold - Supine March - 1 x daily - 7 x weekly - 2 sets - 10 reps   ASSESSMENT:  CLINICAL IMPRESSION: Pt continues to require frequent verbal and tactile cueing during ambulatory activities to promote upright posture and to avoid excessive forward flexion of upper trunk when fatigued during gait in hallway.  Pt. Works hard during resisted ex. And reports no significant increase in right knee or right ankle  pain at end of tx session.   Moderate LE muscle fatigue after completion of 5# ankle wt. LE ex.  Pt. Recently purchased ankle wts. For home use and encourage to start using.  Pt. Will require short seated rest breaks during walking/ balance tasks secondary to fatigue.  Pt. Will continue  to benefit from PT to progress LE strengthening to improve balance/ safety with with walking/ daily activity.    OBJECTIVE IMPAIRMENTS: Abnormal gait, decreased activity tolerance, decreased balance, decreased endurance, decreased knowledge of use of DME, difficulty walking, decreased ROM, decreased strength, postural dysfunction, and pain.   ACTIVITY LIMITATIONS: carrying, lifting, standing, and transfers  PARTICIPATION LIMITATIONS: cleaning, laundry, and community activity  PERSONAL FACTORS: Age and 1-2 comorbidities: hypertension and chronic low back pain are also affecting patient's functional outcome.   REHAB POTENTIAL: Good  CLINICAL DECISION MAKING: Evolving/moderate complexity  EVALUATION COMPLEXITY: High   GOALS: Goals reviewed with patient? Yes  SHORT TERM GOALS: Target date: 05/17/2024 Pt to increase gross bilateral hip strength to at least a 4/5 in order to improve ability to stand for prolonged periods of time which is required for attending church services.  Baseline:see above. 10/23: 4/5 bilaterally and 4-/5 bilateral hip abduction.  12/10: see above Goal status: Partially met  LONG TERM GOALS: Target date: 06/01/2023  Pt to increase 5xSTS score to at least 15 seconds without the use of bilateral upper extremity support a to improve independence with functional transfers and decrease fall risk.  Baseline: 24.47 seconds with bilateral UE usage, (11/11): 16.44 seconds with BUE usage.  12/15: 22.95 sec.  (Took 2 practice attempts to complete proper sit to stand with pushing on arm rest).  No UE assist Goal status: Partially met  2.  Pt to increase LEFs score to at least 49/80 to improve self-confidence of functional abilities with daily activities.  Baseline: 31/80, (11/11): 36/80.  12/15: 43 out of 80 Goal status: Partially met  3.  Pt to improve gait mechanics without significant valgus of right knee and an improved upright posture with the appropriately chosen  assistive device so that he experiences greater ease with ambulating [place] distances with his wife.  Baseline: see above (11/11): significant valgus of right knee with use of SPC in RUE  Goal status: On-going  PLAN:  PT FREQUENCY: 2x/week  PT DURATION: 4 weeks  PLANNED INTERVENTIONS: 97164- PT Re-evaluation, 97110-Therapeutic exercises, 97530- Therapeutic activity, 97112- Neuromuscular re-education, 97535- Self Care, [id number]- Manual therapy, Patient/Family education, Balance training, Cryotherapy, and Moist heat  PLAN FOR NEXT SESSION: Continue gross LE strengthening for carryover for functional gait activities.   Ozell JAYSON Sero, PT, DPT # [id number] 05/22/2024, 11:02 AM  "

## 2024-05-24 ENCOUNTER — Ambulatory Visit: Admitting: Physical Therapy

## 2024-05-24 ENCOUNTER — Encounter: Payer: Self-pay | Admitting: Physical Therapy

## 2024-05-24 DIAGNOSIS — M1710 Unilateral primary osteoarthritis, unspecified knee: Secondary | ICD-10-CM

## 2024-05-24 DIAGNOSIS — M6281 Muscle weakness (generalized): Secondary | ICD-10-CM

## 2024-05-24 DIAGNOSIS — R2689 Other abnormalities of gait and mobility: Secondary | ICD-10-CM

## 2024-05-24 DIAGNOSIS — G8929 Other chronic pain: Secondary | ICD-10-CM

## 2024-05-24 DIAGNOSIS — M2170 Unequal limb length (acquired), unspecified site: Secondary | ICD-10-CM

## 2024-05-24 NOTE — Therapy (Signed)
 " OUTPATIENT PHYSICAL THERAPY LOWER EXTREMITY TREATMENT  Patient Name: Taylor Mcbride MRN: [id number] DOB:03-Apr-1946, 78 y.o., male Today's Date: 05/24/2024  END OF SESSION:  PT End of Session - 05/24/24 0902     Visit Number 21    Number of Visits 25    Date for Recertification  05/31/24    PT Start Time 0856    PT Stop Time 0944    PT Time Calculation (min) 48 min    Equipment Utilized During Treatment Gait belt    Activity Tolerance Patient tolerated treatment well;Patient limited by fatigue;Patient limited by pain    Behavior During Therapy [facility] for tasks assessed/performed          Past Medical History:  Diagnosis Date   Alcohol abuse, in remission    Hyperlipidemia    Hypertension    Spinal stenosis in cervical region    Past Surgical History:  Procedure Laterality Date   CARDIOVASCULAR STRESS TEST  2006   normal, during admission for chest pain,  [place]    COLONOSCOPY WITH PROPOFOL  N/A 05/08/2019   Procedure: COLONOSCOPY WITH PROPOFOL ;  Surgeon: Toledo, Ladell POUR, MD;  Location: [facility] ENDOSCOPY;  Service: Gastroenterology;  Laterality: N/A;   HERNIA REPAIR  1996   right inguinal hernia repair with mesh   SMALL INTESTINE SURGERY     cerivcal disk fusion   Patient Active Problem List   Diagnosis Date Noted   Hypertension 01/05/2012   Hyperlipidemia with target low density lipoprotein (LDL) cholesterol less than 100 mg/dL 91/86/7986   Arrhythmia 01/05/2012   Chronic back pain greater than 3 months duration 01/05/2012   Spinal stenosis in cervical region    Alcohol abuse, in remission    PCP: Delfina Pao, MD  REFERRING PROVIDER: Ewing, Lamar RIGGERS  REFERRING DIAG:  M17.11 (ICD-10-CM) - Unilateral primary osteoarthritis, right knee  M25.561 (ICD-10-CM) - Pain in right knee  G89.29 (ICD-10-CM) - Other chronic pain  R26.81 (ICD-10-CM) - Unsteadiness on feet   THERAPY DIAG:  Muscle weakness (generalized)  Chronic pain of right knee  Other abnormalities  of gait and mobility  Unilateral osteoarthritis of knee  Acquired leg length discrepancy  Rationale for Evaluation and Treatment: Rehabilitation  ONSET DATE: ~August 2025  SUBJECTIVE:   SUBJECTIVE STATEMENT: Pt is a 78 year old male who presents to physical therapy with concerns of chronic right knee pain and general lower extremity strength/endurance concerns affecting his functional independence. Pt has 0/10 right knee pain at rest but shooting pain can raise to a 10/10 with standing for long periods of time at church or walking for long periods of time in the community with his wife. Pt states that he feels fatigued with these activities and has noticed occasional instances of his right kne giving out. Pt reports that this is a chronic issue that has started irritating him more within the past two months. Pain is alleviated with rest and 1 week usage of right knee brace may be helping too, I'm not sure. Pt reports an allergy to NSAIDs so he is unable to take them for pain management at this time.   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 Pt reports having 4 metal rods placed in his right ankle which is why he has been wearing a brace over his right ankle for years. Pt has not used orthotic in right sneaker but does report that his left leg is longer than my right. Pt ambulating with SPC in clinic on  this day but states that this is new for him and that he has been walking at home without a device for the most part. Pt reports being independent at home and participates in yoga twice a week for general stretching/core strengthening.  PAIN:  Are you having pain? Yes: NPRS scale: 0/10 current, 10/10 at worst Pain location: General right knee pain Pain description: shooting Aggravating factors: Prolonged standing, prolonged walking, pivoting/turning at home, walking on outdoor surfaces Relieving factors: Rest  PRECAUTIONS: None  RED  FLAGS: None   WEIGHT BEARING RESTRICTIONS: No  FALLS:  Has patient fallen in last 6 months? Yes. Number of falls 1 (outdoors due to multitasking, assisted by family to get back up)  LIVING ENVIRONMENT: Lives with: lives with their spouse Lives in: House/apartment Stairs: No Has following equipment at home: Single point cane  OCCUPATION: Retired  PLOF: Independent  PATIENT GOALS: To decrease shooting pain in right knee and improve lower extremity strength so that he may return to outdoor walking with his wife and dog with greater ease  NEXT MD VISIT: None scheduled currently  OBJECTIVE:  Note: Objective measures were completed at Evaluation unless otherwise noted.  DIAGNOSTIC FINDINGS: N/A.  See care everywhere for [facility] records.  PATIENT SURVEYS:  LEFS: 31/80  COGNITION: Overall cognitive status: Within functional limits for tasks assessed     SENSATION: Light touch: WFL  MUSCLE LENGTH: Hamstrings: Right 75 deg; Left 65 deg   POSTURE: rounded shoulders, forward head, increased thoracic kyphosis, flexed trunk , and weight shift left  PALPATION: No significant tenderness or pain to touch  LOWER EXTREMITY ROM:  Active ROM Right eval Left eval  Hip flexion 94 deg 92 deg  Hip extension    Hip abduction    Hip adduction    Hip internal rotation 18 deg 14 deg  Hip external rotation 24 deg 29 deg  Knee flexion 121 deg 119 deg  Knee extension -5 deg -1 deg  Ankle dorsiflexion    Ankle plantarflexion    Ankle inversion    Ankle eversion     (Blank rows = not tested)  LOWER EXTREMITY MMT:  MMT Right eval Left eval  Hip flexion -4 -4  Hip extension    Hip abduction 4- 4-  Hip adduction 4- 4  Hip internal rotation 4- 4-  Hip external rotation 4- 4-  Knee flexion 4- 4-  Knee extension 4- 4+  Ankle dorsiflexion NT 4+  Ankle plantarflexion    Ankle inversion    Ankle eversion     (Blank rows = not tested)  LOWER EXTREMITY SPECIAL TESTS:  FADDIR:  Negative SLR: Negative   FUNCTIONAL TESTS:  5xSTS: 24.47 seconds (with bilateral UE support from chair arms)  GAIT: Distance walked: 40 ft around clinic Assistive device utilized: Single point cane and None Level of assistance: Modified independence Comments: Pt ambulated with SPC in RUE for support and was observed to have wide BOS with SPC and lateral trunk lean to left side. Pt ambulated with bilateral shortened step length, forward head posture and rounded shoulders, and decreased cadence. Pt ambulated with a step to gait pattern and decreased hip extension noted during toe off phase. Pt with significant right knee valgus noted during gait and during static stance.   Leg Length from umbilicus: symmetrical bilaterally Right leg length from ASIS: 103 cm (decreased right knee flexion and knee valgus present) Left leg length from ASIS: 100 cm  BERG: 43/50  FGA 10/30 (without use of an  assistive device) mCTSIB: significant sway with EO and EC on foam pad  03/16/2024 MMT Right eval Left eval  Hip flexion 4 4  Hip extension    Hip abduction 4- 4-  Hip adduction 4 4   5xSTS: 16.44 seconds with use of BUE support LEFs: 36 out of 80      LEFS:  43 out of 80 5xSTS: 22.95 sec.  (Took 2 practice attempts to complete proper sit to stand with pushing on arm rest).                                                                                                                             TREATMENT DATE: 05/24/2024    Subjective:  Pt. Entered PT with use of SPC and R ankle brace with no c/o R knee pain at this time.  Pt. Reports no recent falls at home.   Pt. Participated with Yoga class at [facility] yesterday and wife reports pt. did well.    Therapeutic Activity:   Nustep L4 B UE/LE (seat position 11): 10 minutes with no rest breaks.    5# ankle wts.: seated marching 20x (fatigue)/ LAQ 20x (fatigue)/ walking marching 4 laps/ lateral walking 3 laps/ hip extension with light UE assist on  //-bars 20x/ 6 step ups (UE assist on handrails for safety).    Walking in hallway (2 laps) with use of SPC while wearing 5# ankle wts. and cuing for proper BOS/ consistent step pattern/ heel strike.  No increase c/o knee pain but fatigue noted.    Walking in //-bars with 6 hurdle clearance.    Recip. Stair climbing with UE assist (4 stairs x 4).  PT recommends pt descend stairs with step to pattern leading with R LE.  Sit to stands from gray chair with cuing for technique/ upright posture.     Reviewed HEP/ discussed continuing PT tx. Session.  PATIENT EDUCATION:  Education details: Anatomy involved, prognosis, POC, HEP, importance of continued participation in community activities such as walking and yoga Person educated: Patient Education method: Explanation, Demonstration, Tactile cues, Verbal cues, and Handouts Education comprehension: verbalized understanding and returned demonstration  HOME EXERCISE PROGRAM: Access Code: [email or web address] URL: [email or web address] Date: 02/21/2024 Prepared by: Ozell Sero Exercises - Standing Hip Extension with Counter Support - 1 x daily - 7 x weekly - 2 sets - 12 reps - Standing Hip Abduction with Counter Support - 1 x daily - 7 x weekly - 2 sets - 12 reps - Standing Hamstring Stretch with Step - 1 x daily - 7 x weekly - 3 sets - 10 reps - 20 sec. hold - Supine March - 1 x daily - 7 x weekly - 2 sets - 10 reps   ASSESSMENT:  CLINICAL IMPRESSION: Pt continues to require frequent verbal and tactile cueing during ambulatory activities to promote upright posture and to avoid excessive forward flexion of upper trunk when fatigued during gait in hallway.  Pt. Works hard  during resisted ex. And reports no significant increase in right knee or right ankle pain at end of tx session.   Moderate LE muscle fatigue after completion of 5# ankle wt. LE ex.  Pt. Will use ankle wts. At home with standing hip/ walking ex.  Pt. Will require short seated rest  breaks during walking/ balance tasks secondary to fatigue.  Pt. Will continue to benefit from PT to progress LE strengthening to improve balance/ safety with with walking/ daily activity.    OBJECTIVE IMPAIRMENTS: Abnormal gait, decreased activity tolerance, decreased balance, decreased endurance, decreased knowledge of use of DME, difficulty walking, decreased ROM, decreased strength, postural dysfunction, and pain.   ACTIVITY LIMITATIONS: carrying, lifting, standing, and transfers  PARTICIPATION LIMITATIONS: cleaning, laundry, and community activity  PERSONAL FACTORS: Age and 1-2 comorbidities: hypertension and chronic low back pain are also affecting patient's functional outcome.   REHAB POTENTIAL: Good  CLINICAL DECISION MAKING: Evolving/moderate complexity  EVALUATION COMPLEXITY: High   GOALS: Goals reviewed with patient? Yes  SHORT TERM GOALS: Target date: 05/17/2024 Pt to increase gross bilateral hip strength to at least a 4/5 in order to improve ability to stand for prolonged periods of time which is required for attending church services.  Baseline:see above. 10/23: 4/5 bilaterally and 4-/5 bilateral hip abduction.  12/10: see above Goal status: Partially met  LONG TERM GOALS: Target date: 06/01/2023  Pt to increase 5xSTS score to at least 15 seconds without the use of bilateral upper extremity support a to improve independence with functional transfers and decrease fall risk.  Baseline: 24.47 seconds with bilateral UE usage, (11/11): 16.44 seconds with BUE usage.  12/15: 22.95 sec.  (Took 2 practice attempts to complete proper sit to stand with pushing on arm rest).  No UE assist Goal status: Partially met  2.  Pt to increase LEFs score to at least 49/80 to improve self-confidence of functional abilities with daily activities.  Baseline: 31/80, (11/11): 36/80.  12/15: 43 out of 80 Goal status: Partially met  3.  Pt to improve gait mechanics without significant valgus of  right knee and an improved upright posture with the appropriately chosen assistive device so that he experiences greater ease with ambulating [place] distances with his wife.  Baseline: see above (11/11): significant valgus of right knee with use of SPC in RUE  Goal status: On-going  PLAN:  PT FREQUENCY: 2x/week  PT DURATION: 4 weeks  PLANNED INTERVENTIONS: 97164- PT Re-evaluation, 97110-Therapeutic exercises, 97530- Therapeutic activity, 97112- Neuromuscular re-education, 97535- Self Care, [id number]- Manual therapy, Patient/Family education, Balance training, Cryotherapy, and Moist heat  PLAN FOR NEXT SESSION: Continue gross LE strengthening for carryover for functional gait activities.   CHECK GOALS/ RECERT  Ozell JAYSON Sero, PT, DPT # [id number] 05/24/2024, 12:45 PM  "

## 2024-05-30 ENCOUNTER — Encounter: Payer: Self-pay | Admitting: Physical Therapy

## 2024-05-30 ENCOUNTER — Ambulatory Visit: Attending: Physician Assistant | Admitting: Physical Therapy

## 2024-05-30 DIAGNOSIS — M1710 Unilateral primary osteoarthritis, unspecified knee: Secondary | ICD-10-CM | POA: Diagnosis present

## 2024-05-30 DIAGNOSIS — R2689 Other abnormalities of gait and mobility: Secondary | ICD-10-CM | POA: Insufficient documentation

## 2024-05-30 DIAGNOSIS — G8929 Other chronic pain: Secondary | ICD-10-CM | POA: Insufficient documentation

## 2024-05-30 DIAGNOSIS — M25561 Pain in right knee: Secondary | ICD-10-CM | POA: Diagnosis present

## 2024-05-30 DIAGNOSIS — M6281 Muscle weakness (generalized): Secondary | ICD-10-CM | POA: Insufficient documentation

## 2024-05-30 DIAGNOSIS — M2170 Unequal limb length (acquired), unspecified site: Secondary | ICD-10-CM | POA: Insufficient documentation

## 2024-05-30 NOTE — Therapy (Signed)
 " OUTPATIENT PHYSICAL THERAPY LOWER EXTREMITY TREATMENT  Patient Name: Taylor Mcbride MRN: [id number] DOB:06-01-1945, 79 y.o., male Today's Date: 05/30/2024  END OF SESSION:  PT End of Session - 05/30/24 0816     Visit Number 22    Number of Visits 25    Date for Recertification  05/31/24    PT Start Time 0814    PT Stop Time 0911    PT Time Calculation (min) 57 min    Equipment Utilized During Treatment Gait belt    Activity Tolerance Patient tolerated treatment well;Patient limited by fatigue;Patient limited by pain    Behavior During Therapy [facility] for tasks assessed/performed          Past Medical History:  Diagnosis Date   Alcohol abuse, in remission    Hyperlipidemia    Hypertension    Spinal stenosis in cervical region    Past Surgical History:  Procedure Laterality Date   CARDIOVASCULAR STRESS TEST  2006   normal, during admission for chest pain,  [place]    COLONOSCOPY WITH PROPOFOL  N/A 05/08/2019   Procedure: COLONOSCOPY WITH PROPOFOL ;  Surgeon: Toledo, Ladell POUR, MD;  Location: [facility] ENDOSCOPY;  Service: Gastroenterology;  Laterality: N/A;   HERNIA REPAIR  1996   right inguinal hernia repair with mesh   SMALL INTESTINE SURGERY     cerivcal disk fusion   Patient Active Problem List   Diagnosis Date Noted   Hypertension 01/05/2012   Hyperlipidemia with target low density lipoprotein (LDL) cholesterol less than 100 mg/dL 91/86/7986   Arrhythmia 01/05/2012   Chronic back pain greater than 3 months duration 01/05/2012   Spinal stenosis in cervical region    Alcohol abuse, in remission    PCP: Delfina Pao, MD  REFERRING PROVIDER: Ewing, Lamar RIGGERS  REFERRING DIAG:  M17.11 (ICD-10-CM) - Unilateral primary osteoarthritis, right knee  M25.561 (ICD-10-CM) - Pain in right knee  G89.29 (ICD-10-CM) - Other chronic pain  R26.81 (ICD-10-CM) - Unsteadiness on feet   THERAPY DIAG:  Muscle weakness (generalized)  Chronic pain of right knee  Other abnormalities of  gait and mobility  Unilateral osteoarthritis of knee  Acquired leg length discrepancy  Rationale for Evaluation and Treatment: Rehabilitation  ONSET DATE: ~August 2025  SUBJECTIVE:   SUBJECTIVE STATEMENT: Pt is a 79 year old male who presents to physical therapy with concerns of chronic right knee pain and general lower extremity strength/endurance concerns affecting his functional independence. Pt has 0/10 right knee pain at rest but shooting pain can raise to a 10/10 with standing for long periods of time at church or walking for long periods of time in the community with his wife. Pt states that he feels fatigued with these activities and has noticed occasional instances of his right kne giving out. Pt reports that this is a chronic issue that has started irritating him more within the past two months. Pain is alleviated with rest and 1 week usage of right knee brace may be helping too, I'm not sure. Pt reports an allergy to NSAIDs so he is unable to take them for pain management at this time.   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 Pt reports having 4 metal rods placed in his right ankle which is why he has been wearing a brace over his right ankle for years. Pt has not used orthotic in right sneaker but does report that his left leg is longer than my right. Pt ambulating with SPC in clinic on  this day but states that this is new for him and that he has been walking at home without a device for the most part. Pt reports being independent at home and participates in yoga twice a week for general stretching/core strengthening.  PAIN:  Are you having pain? Yes: NPRS scale: 0/10 current, 10/10 at worst Pain location: General right knee pain Pain description: shooting Aggravating factors: Prolonged standing, prolonged walking, pivoting/turning at home, walking on outdoor surfaces Relieving factors: Rest  PRECAUTIONS: None  RED  FLAGS: None   WEIGHT BEARING RESTRICTIONS: No  FALLS:  Has patient fallen in last 6 months? Yes. Number of falls 1 (outdoors due to multitasking, assisted by family to get back up)  LIVING ENVIRONMENT: Lives with: lives with their spouse Lives in: House/apartment Stairs: No Has following equipment at home: Single point cane  OCCUPATION: Retired  PLOF: Independent  PATIENT GOALS: To decrease shooting pain in right knee and improve lower extremity strength so that he may return to outdoor walking with his wife and dog with greater ease  NEXT MD VISIT: None scheduled currently  OBJECTIVE:  Note: Objective measures were completed at Evaluation unless otherwise noted.  DIAGNOSTIC FINDINGS: N/A.  See care everywhere for [facility] records.  PATIENT SURVEYS:  LEFS: 31/80  COGNITION: Overall cognitive status: Within functional limits for tasks assessed     SENSATION: Light touch: WFL  MUSCLE LENGTH: Hamstrings: Right 75 deg; Left 65 deg   POSTURE: rounded shoulders, forward head, increased thoracic kyphosis, flexed trunk , and weight shift left  PALPATION: No significant tenderness or pain to touch  LOWER EXTREMITY ROM:  Active ROM Right eval Left eval  Hip flexion 94 deg 92 deg  Hip extension    Hip abduction    Hip adduction    Hip internal rotation 18 deg 14 deg  Hip external rotation 24 deg 29 deg  Knee flexion 121 deg 119 deg  Knee extension -5 deg -1 deg  Ankle dorsiflexion    Ankle plantarflexion    Ankle inversion    Ankle eversion     (Blank rows = not tested)  LOWER EXTREMITY MMT:  MMT Right eval Left eval  Hip flexion -4 -4  Hip extension    Hip abduction 4- 4-  Hip adduction 4- 4  Hip internal rotation 4- 4-  Hip external rotation 4- 4-  Knee flexion 4- 4-  Knee extension 4- 4+  Ankle dorsiflexion NT 4+  Ankle plantarflexion    Ankle inversion    Ankle eversion     (Blank rows = not tested)  LOWER EXTREMITY SPECIAL TESTS:  FADDIR:  Negative SLR: Negative   FUNCTIONAL TESTS:  5xSTS: 24.47 seconds (with bilateral UE support from chair arms)  GAIT: Distance walked: 40 ft around clinic Assistive device utilized: Single point cane and None Level of assistance: Modified independence Comments: Pt ambulated with SPC in RUE for support and was observed to have wide BOS with SPC and lateral trunk lean to left side. Pt ambulated with bilateral shortened step length, forward head posture and rounded shoulders, and decreased cadence. Pt ambulated with a step to gait pattern and decreased hip extension noted during toe off phase. Pt with significant right knee valgus noted during gait and during static stance.   Leg Length from umbilicus: symmetrical bilaterally Right leg length from ASIS: 103 cm (decreased right knee flexion and knee valgus present) Left leg length from ASIS: 100 cm  BERG: 43/50  FGA 10/30 (without use of an  assistive device) mCTSIB: significant sway with EO and EC on foam pad  03/16/2024 MMT Right eval Left eval  Hip flexion 4 4  Hip extension    Hip abduction 4- 4-  Hip adduction 4 4   5xSTS: 16.44 seconds with use of BUE support LEFs: 36 out of 80      LEFS:  43 out of 80 5xSTS: 22.95 sec.  (Took 2 practice attempts to complete proper sit to stand with pushing on arm rest).                                                                                                                             TREATMENT DATE: 05/30/2024    Subjective:  Pt reports no falls since last visit, will attend a yoga class later today. Reports 3/10 NPS in R knee when on NuStep.  Pt. Entered PT with use of SPC and R ankle brace.  Pts. Wife reports pt. Went for a walk outside yesterday with no issues.    Therapeutic Activity:   Nustep L4 B UE/LE (seat position 11): 10 minutes with no rest breaks.  Discussed weekend activities.    5# ankle wts.: seated marching 20x (fatigue)/ LAQ 20x (fatigue)/ walking marching 4 laps/  lateral walking 3 laps in //-bars/ 6 step ups (UE assist on handrails for safety).    Walking in //-bars (5# ankle wts.): alt. UE/LE touches while walking up/down //-bars 3 laps.  Pt. Requires 1 UE assist on //-bars/ CGA for safety and verbal cuing for posture correction.    Resisted gait in //-bars: 2BTB 4x forward/ backwards and 3x L/R lateral with heavy UE assist.  Pt. Reports increase in neck discomfort during lateral walking.    Walking from PT gym to car with use of SPC and SBA/CGA for safety with 2-point gait pattern.  Moderate cuing to increase R step length and maintain straight line.  Tends to drift to L side in parking lot.    Reviewed HEP/ discussed continuing PT tx. Session.  PATIENT EDUCATION:  Education details: Anatomy involved, prognosis, POC, HEP, importance of continued participation in community activities such as walking and yoga Person educated: Patient Education method: Explanation, Demonstration, Tactile cues, Verbal cues, and Handouts Education comprehension: verbalized understanding and returned demonstration  HOME EXERCISE PROGRAM: Access Code: [email or web address] URL: [email or web address] Date: 02/21/2024 Prepared by: Ozell Sero Exercises - Standing Hip Extension with Counter Support - 1 x daily - 7 x weekly - 2 sets - 12 reps - Standing Hip Abduction with Counter Support - 1 x daily - 7 x weekly - 2 sets - 12 reps - Standing Hamstring Stretch with Step - 1 x daily - 7 x weekly - 3 sets - 10 reps - 20 sec. hold - Supine March - 1 x daily - 7 x weekly - 2 sets - 10 reps   ASSESSMENT:  CLINICAL IMPRESSION: Pt. Had a slight increase in B lower cervical discomfort towards end of  tx. Session.  PT recommended gentle cervical stretches/ MH to manage neck stiffness.  Pt continues to require frequent verbal and tactile cueing during ambulatory activities to promote upright posture and to avoid excessive forward flexion of upper trunk when fatigued during gait in  hallway.  Pt. Works hard during resisted ex. And reports no significant increase in right knee or right ankle pain at end of tx session.   Moderate LE muscle fatigue after completion of 5# ankle wt. LE ex.  Pt. Will use ankle wts. At home with standing hip/ walking ex.  Pt. Will require short seated rest breaks during walking/ balance tasks secondary to fatigue.  Pt. Will continue to benefit from PT to progress LE strengthening to improve balance/ safety with with walking/ daily activity.    OBJECTIVE IMPAIRMENTS: Abnormal gait, decreased activity tolerance, decreased balance, decreased endurance, decreased knowledge of use of DME, difficulty walking, decreased ROM, decreased strength, postural dysfunction, and pain.   ACTIVITY LIMITATIONS: carrying, lifting, standing, and transfers  PARTICIPATION LIMITATIONS: cleaning, laundry, and community activity  PERSONAL FACTORS: Age and 1-2 comorbidities: hypertension and chronic low back pain are also affecting patient's functional outcome.   REHAB POTENTIAL: Good  CLINICAL DECISION MAKING: Evolving/moderate complexity  EVALUATION COMPLEXITY: High   GOALS: Goals reviewed with patient? Yes  SHORT TERM GOALS: Target date: 05/17/2024 Pt to increase gross bilateral hip strength to at least a 4/5 in order to improve ability to stand for prolonged periods of time which is required for attending church services.  Baseline:see above. 10/23: 4/5 bilaterally and 4-/5 bilateral hip abduction.  12/10: see above Goal status: Partially met  LONG TERM GOALS: Target date: 06/01/2023  Pt to increase 5xSTS score to at least 15 seconds without the use of bilateral upper extremity support a to improve independence with functional transfers and decrease fall risk.  Baseline: 24.47 seconds with bilateral UE usage, (11/11): 16.44 seconds with BUE usage.  12/15: 22.95 sec.  (Took 2 practice attempts to complete proper sit to stand with pushing on arm rest).  No UE  assist Goal status: Partially met  2.  Pt to increase LEFs score to at least 49/80 to improve self-confidence of functional abilities with daily activities.  Baseline: 31/80, (11/11): 36/80.  12/15: 43 out of 80 Goal status: Partially met  3.  Pt to improve gait mechanics without significant valgus of right knee and an improved upright posture with the appropriately chosen assistive device so that he experiences greater ease with ambulating [place] distances with his wife.  Baseline: see above (11/11): significant valgus of right knee with use of SPC in RUE  Goal status: On-going  PLAN:  PT FREQUENCY: 2x/week  PT DURATION: 4 weeks  PLANNED INTERVENTIONS: 97164- PT Re-evaluation, 97110-Therapeutic exercises, 97530- Therapeutic activity, 97112- Neuromuscular re-education, 97535- Self Care, [id number]- Manual therapy, Patient/Family education, Balance training, Cryotherapy, and Moist heat  PLAN FOR NEXT SESSION: Continue gross LE strengthening for carryover for functional gait activities.   CHECK GOALS/ RECERT  Ozell JAYSON Sero, PT, DPT # [id number] 05/30/2024, 9:16 AM  "

## 2024-06-05 ENCOUNTER — Encounter: Payer: Self-pay | Admitting: Physical Therapy

## 2024-06-05 ENCOUNTER — Ambulatory Visit: Admitting: Physical Therapy

## 2024-06-05 DIAGNOSIS — M1710 Unilateral primary osteoarthritis, unspecified knee: Secondary | ICD-10-CM

## 2024-06-05 DIAGNOSIS — G8929 Other chronic pain: Secondary | ICD-10-CM

## 2024-06-05 DIAGNOSIS — R2689 Other abnormalities of gait and mobility: Secondary | ICD-10-CM

## 2024-06-05 DIAGNOSIS — M6281 Muscle weakness (generalized): Secondary | ICD-10-CM

## 2024-06-05 DIAGNOSIS — M2170 Unequal limb length (acquired), unspecified site: Secondary | ICD-10-CM

## 2024-06-05 NOTE — Therapy (Signed)
 " OUTPATIENT PHYSICAL THERAPY LOWER EXTREMITY TREATMENT/ RECERTIFICATION  Patient Name: Taylor Mcbride MRN: [id number] DOB:1945/12/02, 79 y.o., male Today's Date: 06/05/2024  END OF SESSION:  PT End of Session - 06/05/24 0945     Visit Number 23    Number of Visits 35    Date for Recertification  07/17/24    PT Start Time 0946    PT Stop Time 1035    PT Time Calculation (min) 49 min    Equipment Utilized During Treatment Gait belt    Activity Tolerance Patient tolerated treatment well;Patient limited by fatigue;Patient limited by pain    Behavior During Therapy [facility] for tasks assessed/performed          Past Medical History:  Diagnosis Date   Alcohol abuse, in remission    Hyperlipidemia    Hypertension    Spinal stenosis in cervical region    Past Surgical History:  Procedure Laterality Date   CARDIOVASCULAR STRESS TEST  2006   normal, during admission for chest pain,  [place]    COLONOSCOPY WITH PROPOFOL  N/A 05/08/2019   Procedure: COLONOSCOPY WITH PROPOFOL ;  Surgeon: Toledo, Ladell POUR, MD;  Location: [facility] ENDOSCOPY;  Service: Gastroenterology;  Laterality: N/A;   HERNIA REPAIR  1996   right inguinal hernia repair with mesh   SMALL INTESTINE SURGERY     cerivcal disk fusion   Patient Active Problem List   Diagnosis Date Noted   Hypertension 01/05/2012   Hyperlipidemia with target low density lipoprotein (LDL) cholesterol less than 100 mg/dL 91/86/7986   Arrhythmia 01/05/2012   Chronic back pain greater than 3 months duration 01/05/2012   Spinal stenosis in cervical region    Alcohol abuse, in remission    PCP: Delfina Pao, MD  REFERRING PROVIDER: Ewing, Lamar RIGGERS  REFERRING DIAG:  M17.11 (ICD-10-CM) - Unilateral primary osteoarthritis, right knee  M25.561 (ICD-10-CM) - Pain in right knee  G89.29 (ICD-10-CM) - Other chronic pain  R26.81 (ICD-10-CM) - Unsteadiness on feet   THERAPY DIAG:  Muscle weakness (generalized)  Chronic pain of right knee  Other  abnormalities of gait and mobility  Unilateral osteoarthritis of knee  Acquired leg length discrepancy  Rationale for Evaluation and Treatment: Rehabilitation  ONSET DATE: ~August 2025  SUBJECTIVE:   SUBJECTIVE STATEMENT: Pt is a 79 year old male who presents to physical therapy with concerns of chronic right knee pain and general lower extremity strength/endurance concerns affecting his functional independence. Pt has 0/10 right knee pain at rest but shooting pain can raise to a 10/10 with standing for long periods of time at church or walking for long periods of time in the community with his wife. Pt states that he feels fatigued with these activities and has noticed occasional instances of his right kne giving out. Pt reports that this is a chronic issue that has started irritating him more within the past two months. Pain is alleviated with rest and 1 week usage of right knee brace may be helping too, I'm not sure. Pt reports an allergy to NSAIDs so he is unable to take them for pain management at this time.   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 Pt reports having 4 metal rods placed in his right ankle which is why he has been wearing a brace over his right ankle for years. Pt has not used orthotic in right sneaker but does report that his left leg is longer than my right. Pt ambulating with SPC in clinic  on this day but states that this is new for him and that he has been walking at home without a device for the most part. Pt reports being independent at home and participates in yoga twice a week for general stretching/core strengthening.  PAIN:  Are you having pain? Yes: NPRS scale: 0/10 current, 10/10 at worst Pain location: General right knee pain Pain description: shooting Aggravating factors: Prolonged standing, prolonged walking, pivoting/turning at home, walking on outdoor surfaces Relieving factors: Rest  PRECAUTIONS:  None  RED FLAGS: None   WEIGHT BEARING RESTRICTIONS: No  FALLS:  Has patient fallen in last 6 months? Yes. Number of falls 1 (outdoors due to multitasking, assisted by family to get back up)  LIVING ENVIRONMENT: Lives with: lives with their spouse Lives in: House/apartment Stairs: No Has following equipment at home: Single point cane  OCCUPATION: Retired  PLOF: Independent  PATIENT GOALS: To decrease shooting pain in right knee and improve lower extremity strength so that he may return to outdoor walking with his wife and dog with greater ease  NEXT MD VISIT: None scheduled currently  OBJECTIVE:  Note: Objective measures were completed at Evaluation unless otherwise noted.  DIAGNOSTIC FINDINGS: N/A.  See care everywhere for [facility] records.  PATIENT SURVEYS:  LEFS: 31/80  COGNITION: Overall cognitive status: Within functional limits for tasks assessed     SENSATION: Light touch: WFL  MUSCLE LENGTH: Hamstrings: Right 75 deg; Left 65 deg   POSTURE: rounded shoulders, forward head, increased thoracic kyphosis, flexed trunk , and weight shift left  PALPATION: No significant tenderness or pain to touch  LOWER EXTREMITY ROM:  Active ROM Right eval Left eval  Hip flexion 94 deg 92 deg  Hip extension    Hip abduction    Hip adduction    Hip internal rotation 18 deg 14 deg  Hip external rotation 24 deg 29 deg  Knee flexion 121 deg 119 deg  Knee extension -5 deg -1 deg  Ankle dorsiflexion    Ankle plantarflexion    Ankle inversion    Ankle eversion     (Blank rows = not tested)  LOWER EXTREMITY MMT:  MMT Right eval Left eval  Hip flexion -4 -4  Hip extension    Hip abduction 4- 4-  Hip adduction 4- 4  Hip internal rotation 4- 4-  Hip external rotation 4- 4-  Knee flexion 4- 4-  Knee extension 4- 4+  Ankle dorsiflexion NT 4+  Ankle plantarflexion    Ankle inversion    Ankle eversion     (Blank rows = not tested)  LOWER EXTREMITY SPECIAL TESTS:   FADDIR: Negative SLR: Negative   FUNCTIONAL TESTS:  5xSTS: 24.47 seconds (with bilateral UE support from chair arms)  GAIT: Distance walked: 40 ft around clinic Assistive device utilized: Single point cane and None Level of assistance: Modified independence Comments: Pt ambulated with SPC in RUE for support and was observed to have wide BOS with SPC and lateral trunk lean to left side. Pt ambulated with bilateral shortened step length, forward head posture and rounded shoulders, and decreased cadence. Pt ambulated with a step to gait pattern and decreased hip extension noted during toe off phase. Pt with significant right knee valgus noted during gait and during static stance.   Leg Length from umbilicus: symmetrical bilaterally Right leg length from ASIS: 103 cm (decreased right knee flexion and knee valgus present) Left leg length from ASIS: 100 cm  BERG: 43/50  FGA 10/30 (without use of  an assistive device) mCTSIB: significant sway with EO and EC on foam pad  03/16/2024 MMT Right eval Left eval  Hip flexion 4 4  Hip extension    Hip abduction 4- 4-  Hip adduction 4 4   5xSTS: 16.44 seconds with use of BUE support LEFs: 36 out of 80      LEFS:  43 out of 80 5xSTS: 22.95 sec.  (Took 2 practice attempts to complete proper sit to stand with pushing on arm rest).                                                                                                                             TREATMENT DATE: 06/05/2024    Subjective:   Pt. entered PT with use of SPC and R ankle brace slight knee pain 3/10 NPS on Nu-Step today . Reports yoga class went well last week. Pt reports walking around the house without cane. Pt reports no falls since last visit and pts. Wife is happy with pts. Progress.     Nustep L4 B UE/LE (seat position 11): 10 minutes with no rest breaks.  Discussed weekend activities.    Therapeutic Activity:   5# ankle wts.: seated marching 20x (fatigue)/ LAQ 20x  (fatigue)/ 2 sets. Walking marching 4 laps/ lateral walking 3 laps in //-bars/ 6 step ups (UE assist on handrails for safety).    Stair taps with 5# weights 2x20.  Moderate B UE assist on handrails for balance/ safety.    5xSTS: 25.75 seconds (no UE assist).     Walking from PT gym to car with use of SPC and SBA/CGA for safety with 2-point gait pattern.  Moderate cuing to increase R step length and maintain straight line. Tends to drift to L side in parking lot.   Reassessed goals  Reviewed HEP/ discussed continuing PT tx.  PATIENT EDUCATION:  Education details: Anatomy involved, prognosis, POC, HEP, importance of continued participation in community activities such as walking and yoga Person educated: Patient Education method: Explanation, Demonstration, Tactile cues, Verbal cues, and Handouts Education comprehension: verbalized understanding and returned demonstration  HOME EXERCISE PROGRAM: Access Code: [email or web address] URL: [email or web address] Date: 02/21/2024 Prepared by: Ozell Sero Exercises - Standing Hip Extension with Counter Support - 1 x daily - 7 x weekly - 2 sets - 12 reps - Standing Hip Abduction with Counter Support - 1 x daily - 7 x weekly - 2 sets - 12 reps - Standing Hamstring Stretch with Step - 1 x daily - 7 x weekly - 3 sets - 10 reps - 20 sec. hold - Supine March - 1 x daily - 7 x weekly - 2 sets - 10 reps   ASSESSMENT:  CLINICAL IMPRESSION: Pt. Reports walking more around the house without cane. Required frequent cues to maintain upright posture during ambulatory activities. Patient works hard during exercises and often required rest breaks with muscle fatigue after 5# ankle weight activities. No increase c/o knee or  ankle pain after tx.  See updated goals.  Pt. Continues to improve with STS with no UE assist and no LOB.  Pt. Will continue to benefit from PT to progress LE strengthening to improve balance/ safety with with walking/ daily activity.     OBJECTIVE IMPAIRMENTS: Abnormal gait, decreased activity tolerance, decreased balance, decreased endurance, decreased knowledge of use of DME, difficulty walking, decreased ROM, decreased strength, postural dysfunction, and pain.   ACTIVITY LIMITATIONS: carrying, lifting, standing, and transfers  PARTICIPATION LIMITATIONS: cleaning, laundry, and community activity  PERSONAL FACTORS: Age and 1-2 comorbidities: hypertension and chronic low back pain are also affecting patient's functional outcome.   REHAB POTENTIAL: Good  CLINICAL DECISION MAKING: Evolving/moderate complexity  EVALUATION COMPLEXITY: High   GOALS: Goals reviewed with patient? Yes  LONG TERM GOALS: Target date: 07/17/2024   Pt to increase gross bilateral hip strength to at least a 4/5 in order to improve ability to stand for prolonged periods of time which is required for attending church services.  Baseline:see above. 10/23: 4/5 bilaterally and 4-/5 bilateral hip abduction.  12/10: see above.  1/12:  B hip flexion strength grossly 4/5 MMT Goal status: Partially met  2.   Pt to increase 5xSTS score to at least <20 seconds without the use of bilateral upper extremity support a to improve independence with functional transfers and decrease fall risk.  Baseline: 24.47 seconds with bilateral UE usage, (11/11): 16.44 seconds with BUE usage.  12/15: 22.95 sec.  (Took 2 practice attempts to complete proper sit to stand with pushing on arm rest).  No UE assist.  1/12: 25.75 sec. (No UE) Goal status: Partially met  3.  Pt to increase LEFs score to at least 49/80 to improve self-confidence of functional abilities with daily activities.  Baseline: 31/80, (11/11): 36/80.  12/15: 43 out of 80 Goal status: Partially met  4.   Pt will ambulate with consistent recip. Gait pattern with least assistive device for 10 minutes to improve community ambulation/ mod. I.   Baseline: see above (11/11): significant valgus of right knee with use  of SPC in RUE  Goal status: Partially met  PLAN:  PT FREQUENCY: 2x/week  PT DURATION: 6 weeks  PLANNED INTERVENTIONS: 97164- PT Re-evaluation, 97110-Therapeutic exercises, 97530- Therapeutic activity, 97112- Neuromuscular re-education, 97535- Self Care, [id number]- Manual therapy, Patient/Family education, Balance training, Cryotherapy, and Moist heat  PLAN FOR NEXT SESSION: Continue gross LE strengthening for carryover for functional gait activities.     Ozell JAYSON Sero, PT, DPT # [id number] [place], SPT 06/05/2024, 2:44 PM  "

## 2024-06-07 ENCOUNTER — Ambulatory Visit: Admitting: Physical Therapy

## 2024-06-07 DIAGNOSIS — M1710 Unilateral primary osteoarthritis, unspecified knee: Secondary | ICD-10-CM

## 2024-06-07 DIAGNOSIS — R2689 Other abnormalities of gait and mobility: Secondary | ICD-10-CM

## 2024-06-07 DIAGNOSIS — M6281 Muscle weakness (generalized): Secondary | ICD-10-CM

## 2024-06-07 DIAGNOSIS — M2170 Unequal limb length (acquired), unspecified site: Secondary | ICD-10-CM

## 2024-06-07 DIAGNOSIS — G8929 Other chronic pain: Secondary | ICD-10-CM

## 2024-06-07 NOTE — Therapy (Signed)
 " OUTPATIENT PHYSICAL THERAPY LOWER EXTREMITY TREATMENT  Patient Name: Taylor Mcbride MRN: [id number] DOB:December 08, 1945, 79 y.o., male Today's Date: 06/08/2024  END OF SESSION:  PT End of Session - 06/07/24 0909     Visit Number 24    Number of Visits 35    Date for Recertification  07/17/24    PT Start Time 0900    PT Stop Time 0945    PT Time Calculation (min) 45 min    Equipment Utilized During Treatment Gait belt    Activity Tolerance Patient tolerated treatment well;Patient limited by fatigue;Patient limited by pain    Behavior During Therapy [facility] for tasks assessed/performed          Past Medical History:  Diagnosis Date   Alcohol abuse, in remission    Hyperlipidemia    Hypertension    Spinal stenosis in cervical region    Past Surgical History:  Procedure Laterality Date   CARDIOVASCULAR STRESS TEST  2006   normal, during admission for chest pain,  [place]    COLONOSCOPY WITH PROPOFOL  N/A 05/08/2019   Procedure: COLONOSCOPY WITH PROPOFOL ;  Surgeon: Toledo, Ladell POUR, MD;  Location: [facility] ENDOSCOPY;  Service: Gastroenterology;  Laterality: N/A;   HERNIA REPAIR  1996   right inguinal hernia repair with mesh   SMALL INTESTINE SURGERY     cerivcal disk fusion   Patient Active Problem List   Diagnosis Date Noted   Hypertension 01/05/2012   Hyperlipidemia with target low density lipoprotein (LDL) cholesterol less than 100 mg/dL 91/86/7986   Arrhythmia 01/05/2012   Chronic back pain greater than 3 months duration 01/05/2012   Spinal stenosis in cervical region    Alcohol abuse, in remission    PCP: Delfina Pao, MD  REFERRING PROVIDER: Ewing, Lamar RIGGERS  REFERRING DIAG:  M17.11 (ICD-10-CM) - Unilateral primary osteoarthritis, right knee  M25.561 (ICD-10-CM) - Pain in right knee  G89.29 (ICD-10-CM) - Other chronic pain  R26.81 (ICD-10-CM) - Unsteadiness on feet   THERAPY DIAG:  Muscle weakness (generalized)  Chronic pain of right knee  Other abnormalities of  gait and mobility  Unilateral osteoarthritis of knee  Acquired leg length discrepancy  Rationale for Evaluation and Treatment: Rehabilitation  ONSET DATE: ~August 2025  SUBJECTIVE:   SUBJECTIVE STATEMENT: Pt is a 79 year old male who presents to physical therapy with concerns of chronic right knee pain and general lower extremity strength/endurance concerns affecting his functional independence. Pt has 0/10 right knee pain at rest but shooting pain can raise to a 10/10 with standing for long periods of time at church or walking for long periods of time in the community with his wife. Pt states that he feels fatigued with these activities and has noticed occasional instances of his right kne giving out. Pt reports that this is a chronic issue that has started irritating him more within the past two months. Pain is alleviated with rest and 1 week usage of right knee brace may be helping too, I'm not sure. Pt reports an allergy to NSAIDs so he is unable to take them for pain management at this time.   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 Pt reports having 4 metal rods placed in his right ankle which is why he has been wearing a brace over his right ankle for years. Pt has not used orthotic in right sneaker but does report that his left leg is longer than my right. Pt ambulating with SPC in clinic on  this day but states that this is new for him and that he has been walking at home without a device for the most part. Pt reports being independent at home and participates in yoga twice a week for general stretching/core strengthening.  PAIN:  Are you having pain? Yes: NPRS scale: 0/10 current, 10/10 at worst Pain location: General right knee pain Pain description: shooting Aggravating factors: Prolonged standing, prolonged walking, pivoting/turning at home, walking on outdoor surfaces Relieving factors: Rest  PRECAUTIONS: None  RED  FLAGS: None   WEIGHT BEARING RESTRICTIONS: No  FALLS:  Has patient fallen in last 6 months? Yes. Number of falls 1 (outdoors due to multitasking, assisted by family to get back up)  LIVING ENVIRONMENT: Lives with: lives with their spouse Lives in: House/apartment Stairs: No Has following equipment at home: Single point cane  OCCUPATION: Retired  PLOF: Independent  PATIENT GOALS: To decrease shooting pain in right knee and improve lower extremity strength so that he may return to outdoor walking with his wife and dog with greater ease  NEXT MD VISIT: None scheduled currently  OBJECTIVE:  Note: Objective measures were completed at Evaluation unless otherwise noted.  DIAGNOSTIC FINDINGS: N/A.  See care everywhere for [facility] records.  PATIENT SURVEYS:  LEFS: 31/80  COGNITION: Overall cognitive status: Within functional limits for tasks assessed     SENSATION: Light touch: WFL  MUSCLE LENGTH: Hamstrings: Right 75 deg; Left 65 deg   POSTURE: rounded shoulders, forward head, increased thoracic kyphosis, flexed trunk , and weight shift left  PALPATION: No significant tenderness or pain to touch  LOWER EXTREMITY ROM:  Active ROM Right eval Left eval  Hip flexion 94 deg 92 deg  Hip extension    Hip abduction    Hip adduction    Hip internal rotation 18 deg 14 deg  Hip external rotation 24 deg 29 deg  Knee flexion 121 deg 119 deg  Knee extension -5 deg -1 deg  Ankle dorsiflexion    Ankle plantarflexion    Ankle inversion    Ankle eversion     (Blank rows = not tested)  LOWER EXTREMITY MMT:  MMT Right eval Left eval  Hip flexion -4 -4  Hip extension    Hip abduction 4- 4-  Hip adduction 4- 4  Hip internal rotation 4- 4-  Hip external rotation 4- 4-  Knee flexion 4- 4-  Knee extension 4- 4+  Ankle dorsiflexion NT 4+  Ankle plantarflexion    Ankle inversion    Ankle eversion     (Blank rows = not tested)  LOWER EXTREMITY SPECIAL TESTS:  FADDIR:  Negative SLR: Negative   FUNCTIONAL TESTS:  5xSTS: 24.47 seconds (with bilateral UE support from chair arms)  GAIT: Distance walked: 40 ft around clinic Assistive device utilized: Single point cane and None Level of assistance: Modified independence Comments: Pt ambulated with SPC in RUE for support and was observed to have wide BOS with SPC and lateral trunk lean to left side. Pt ambulated with bilateral shortened step length, forward head posture and rounded shoulders, and decreased cadence. Pt ambulated with a step to gait pattern and decreased hip extension noted during toe off phase. Pt with significant right knee valgus noted during gait and during static stance.   Leg Length from umbilicus: symmetrical bilaterally Right leg length from ASIS: 103 cm (decreased right knee flexion and knee valgus present) Left leg length from ASIS: 100 cm  BERG: 43/50  FGA 10/30 (without use of an  assistive device) mCTSIB: significant sway with EO and EC on foam pad  03/16/2024 MMT Right eval Left eval  Hip flexion 4 4  Hip extension    Hip abduction 4- 4-  Hip adduction 4 4   5xSTS: 16.44 seconds with use of BUE support LEFs: 36 out of 80      LEFS:  43 out of 80 5xSTS: 22.95 sec.  (Took 2 practice attempts to complete proper sit to stand with pushing on arm rest).                                                                                                                             TREATMENT DATE: 06/08/2024    Subjective:   Pt entered PT without his SPC today which he left at home. Was feeling tired this morning. Pt reported feeling 3/10 pain in the knee and no falls since last session. Pt has been able to get around his house well. Pts wife is happy with progress.    Therapeutic Exercise:  Nustep L4 B UE/LE (seat position 11): 10 minutes with no rest breaks.  Discussed weekend activities.   5# ankle wts.: seated marching 20x (fatigue)/ LAQ 20x (fatigue)/ 2 sets.   Discussed  HEP  Therapeutic Activity:   In // bars: Walking marching 4 laps. 6 hurdles walks 3 laps  (UE assist on handrails for safety).    Stair taps with 5# weights 2x20.  Moderate B UE assist on handrails for balance/ safety.    Seated B hip adductor ball squeezes 5 sec holds 2x12.  Walking in hallway 121ft with SBA/CGA. Cuing for consistent heal strike and toe off   STS in waiting room 5x.      Walking from PT gym to car and SBA/CGA for safety with 2-point gait pattern.  Moderate cuing to increase R step length, increase hip flexion, and maintain straight line. Tends to drift to L side in parking lot.    PATIENT EDUCATION:  Education details: Anatomy involved, prognosis, POC, HEP, importance of continued participation in community activities such as walking and yoga Person educated: Patient Education method: Explanation, Demonstration, Tactile cues, Verbal cues, and Handouts Education comprehension: verbalized understanding and returned demonstration  HOME EXERCISE PROGRAM: Access Code: [email or web address] URL: [email or web address] Date: 02/21/2024 Prepared by: Ozell Sero Exercises - Standing Hip Extension with Counter Support - 1 x daily - 7 x weekly - 2 sets - 12 reps - Standing Hip Abduction with Counter Support - 1 x daily - 7 x weekly - 2 sets - 12 reps - Standing Hamstring Stretch with Step - 1 x daily - 7 x weekly - 3 sets - 10 reps - 20 sec. hold - Supine March - 1 x daily - 7 x weekly - 2 sets - 10 reps   ASSESSMENT:  CLINICAL IMPRESSION: Pt reports walking more around the house without cane is going well. Required frequent cues to maintain upright posture during ambulatory activities, and  required cuing to facilitate heel strike and toe off. Patient works hard during exercises and often required rest breaks with muscle fatigue after 5# ankle weight activities. Pt did well walking in hallway No increase c/o knee or ankle pain after tx.  Pt. Will continue to benefit from PT to  progress LE strengthening to improve balance/ safety with with walking/ daily activity.    OBJECTIVE IMPAIRMENTS: Abnormal gait, decreased activity tolerance, decreased balance, decreased endurance, decreased knowledge of use of DME, difficulty walking, decreased ROM, decreased strength, postural dysfunction, and pain.   ACTIVITY LIMITATIONS: carrying, lifting, standing, and transfers  PARTICIPATION LIMITATIONS: cleaning, laundry, and community activity  PERSONAL FACTORS: Age and 1-2 comorbidities: hypertension and chronic low back pain are also affecting patient's functional outcome.   REHAB POTENTIAL: Good  CLINICAL DECISION MAKING: Evolving/moderate complexity  EVALUATION COMPLEXITY: High   GOALS: Goals reviewed with patient? Yes  LONG TERM GOALS: Target date: 07/17/2024   Pt to increase gross bilateral hip strength to at least a 4/5 in order to improve ability to stand for prolonged periods of time which is required for attending church services.  Baseline:see above. 10/23: 4/5 bilaterally and 4-/5 bilateral hip abduction.  12/10: see above.  1/12:  B hip flexion strength grossly 4/5 MMT Goal status: Partially met  2.   Pt to increase 5xSTS score to at least <20 seconds without the use of bilateral upper extremity support a to improve independence with functional transfers and decrease fall risk.  Baseline: 24.47 seconds with bilateral UE usage, (11/11): 16.44 seconds with BUE usage.  12/15: 22.95 sec.  (Took 2 practice attempts to complete proper sit to stand with pushing on arm rest).  No UE assist.  1/12: 25.75 sec. (No UE) Goal status: Partially met  3.  Pt to increase LEFs score to at least 49/80 to improve self-confidence of functional abilities with daily activities.  Baseline: 31/80, (11/11): 36/80.  12/15: 43 out of 80 Goal status: Partially met  4.   Pt will ambulate with consistent recip. Gait pattern with least assistive device for 10 minutes to improve community  ambulation/ mod. I.   Baseline: see above (11/11): significant valgus of right knee with use of SPC in RUE  Goal status: Partially met  PLAN:  PT FREQUENCY: 2x/week  PT DURATION: 6 weeks  PLANNED INTERVENTIONS: 97164- PT Re-evaluation, 97110-Therapeutic exercises, 97530- Therapeutic activity, 97112- Neuromuscular re-education, 97535- Self Care, [id number]- Manual therapy, Patient/Family education, Balance training, Cryotherapy, and Moist heat  PLAN FOR NEXT SESSION: Continue gross LE strengthening for carryover for functional gait activities.     Ozell JAYSON Sero, PT, DPT # [id number] [place], SPT 06/08/2024, 7:31 AM  "

## 2024-06-12 ENCOUNTER — Encounter: Payer: Self-pay | Admitting: Physical Therapy

## 2024-06-12 ENCOUNTER — Ambulatory Visit: Admitting: Physical Therapy

## 2024-06-12 DIAGNOSIS — M2170 Unequal limb length (acquired), unspecified site: Secondary | ICD-10-CM

## 2024-06-12 DIAGNOSIS — R2689 Other abnormalities of gait and mobility: Secondary | ICD-10-CM

## 2024-06-12 DIAGNOSIS — M6281 Muscle weakness (generalized): Secondary | ICD-10-CM

## 2024-06-12 DIAGNOSIS — M1710 Unilateral primary osteoarthritis, unspecified knee: Secondary | ICD-10-CM

## 2024-06-12 DIAGNOSIS — G8929 Other chronic pain: Secondary | ICD-10-CM

## 2024-06-12 NOTE — Therapy (Signed)
 " OUTPATIENT PHYSICAL THERAPY LOWER EXTREMITY TREATMENT  Patient Name: Christon Parada MRN: [id number] DOB:[birth year], 79 y.o., male Today's Date: 06/12/2024  END OF SESSION:  PT End of Session - 06/12/24 0853     Visit Number 25    Number of Visits 35    Date for Recertification  07/17/24    PT Start Time 0853    PT Stop Time 0945    PT Time Calculation (min) 52 min    Equipment Utilized During Treatment Gait belt    Activity Tolerance Patient tolerated treatment well;Patient limited by fatigue;Patient limited by pain    Behavior During Therapy [facility] for tasks assessed/performed          Past Medical History:  Diagnosis Date   Alcohol abuse, in remission    Hyperlipidemia    Hypertension    Spinal stenosis in cervical region    Past Surgical History:  Procedure Laterality Date   CARDIOVASCULAR STRESS TEST  2006   normal, during admission for chest pain,  [place]    COLONOSCOPY WITH PROPOFOL  N/A 05/08/2019   Procedure: COLONOSCOPY WITH PROPOFOL ;  Surgeon: Toledo, Ladell POUR, MD;  Location: [facility] ENDOSCOPY;  Service: Gastroenterology;  Laterality: N/A;   HERNIA REPAIR  1996   right inguinal hernia repair with mesh   SMALL INTESTINE SURGERY     cerivcal disk fusion   Patient Active Problem List   Diagnosis Date Noted   Hypertension 01/05/2012   Hyperlipidemia with target low density lipoprotein (LDL) cholesterol less than 100 mg/dL 91/86/7986   Arrhythmia 01/05/2012   Chronic back pain greater than 3 months duration 01/05/2012   Spinal stenosis in cervical region    Alcohol abuse, in remission    PCP: Delfina Pao, MD  REFERRING PROVIDER: Ewing, Lamar RIGGERS  REFERRING DIAG:  M17.11 (ICD-10-CM) - Unilateral primary osteoarthritis, right knee  M25.561 (ICD-10-CM) - Pain in right knee  G89.29 (ICD-10-CM) - Other chronic pain  R26.81 (ICD-10-CM) - Unsteadiness on feet   THERAPY DIAG:  Muscle weakness (generalized)  Chronic pain of right knee  Other abnormalities of  gait and mobility  Unilateral osteoarthritis of knee  Acquired leg length discrepancy  Rationale for Evaluation and Treatment: Rehabilitation  ONSET DATE: ~August 2025  SUBJECTIVE:   SUBJECTIVE STATEMENT: Pt is a 79 year old male who presents to physical therapy with concerns of chronic right knee pain and general lower extremity strength/endurance concerns affecting his functional independence. Pt has 0/10 right knee pain at rest but shooting pain can raise to a 10/10 with standing for long periods of time at church or walking for long periods of time in the community with his wife. Pt states that he feels fatigued with these activities and has noticed occasional instances of his right kne giving out. Pt reports that this is a chronic issue that has started irritating him more within the past two months. Pain is alleviated with rest and 1 week usage of right knee brace may be helping too, I'm not sure. Pt reports an allergy to NSAIDs so he is unable to take them for pain management at this time.   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 Pt reports having 4 metal rods placed in his right ankle which is why he has been wearing a brace over his right ankle for years. Pt has not used orthotic in right sneaker but does report that his left leg is longer than my right. Pt ambulating with SPC in clinic on  this day but states that this is new for him and that he has been walking at home without a device for the most part. Pt reports being independent at home and participates in yoga twice a week for general stretching/core strengthening.  PAIN:  Are you having pain? Yes: NPRS scale: 0/10 current, 10/10 at worst Pain location: General right knee pain Pain description: shooting Aggravating factors: Prolonged standing, prolonged walking, pivoting/turning at home, walking on outdoor surfaces Relieving factors: Rest  PRECAUTIONS: None  RED  FLAGS: None   WEIGHT BEARING RESTRICTIONS: No  FALLS:  Has patient fallen in last 6 months? Yes. Number of falls 1 (outdoors due to multitasking, assisted by family to get back up)  LIVING ENVIRONMENT: Lives with: lives with their spouse Lives in: House/apartment Stairs: No Has following equipment at home: Single point cane  OCCUPATION: Retired  PLOF: Independent  PATIENT GOALS: To decrease shooting pain in right knee and improve lower extremity strength so that he may return to outdoor walking with his wife and dog with greater ease  NEXT MD VISIT: None scheduled currently  OBJECTIVE:  Note: Objective measures were completed at Evaluation unless otherwise noted.  DIAGNOSTIC FINDINGS: N/A.  See care everywhere for [facility] records.  PATIENT SURVEYS:  LEFS: 31/80  COGNITION: Overall cognitive status: Within functional limits for tasks assessed     SENSATION: Light touch: WFL  MUSCLE LENGTH: Hamstrings: Right 75 deg; Left 65 deg   POSTURE: rounded shoulders, forward head, increased thoracic kyphosis, flexed trunk , and weight shift left  PALPATION: No significant tenderness or pain to touch  LOWER EXTREMITY ROM:  Active ROM Right eval Left eval  Hip flexion 94 deg 92 deg  Hip extension    Hip abduction    Hip adduction    Hip internal rotation 18 deg 14 deg  Hip external rotation 24 deg 29 deg  Knee flexion 121 deg 119 deg  Knee extension -5 deg -1 deg  Ankle dorsiflexion    Ankle plantarflexion    Ankle inversion    Ankle eversion     (Blank rows = not tested)  LOWER EXTREMITY MMT:  MMT Right eval Left eval  Hip flexion -4 -4  Hip extension    Hip abduction 4- 4-  Hip adduction 4- 4  Hip internal rotation 4- 4-  Hip external rotation 4- 4-  Knee flexion 4- 4-  Knee extension 4- 4+  Ankle dorsiflexion NT 4+  Ankle plantarflexion    Ankle inversion    Ankle eversion     (Blank rows = not tested)  LOWER EXTREMITY SPECIAL TESTS:  FADDIR:  Negative SLR: Negative   FUNCTIONAL TESTS:  5xSTS: 24.47 seconds (with bilateral UE support from chair arms)  GAIT: Distance walked: 40 ft around clinic Assistive device utilized: Single point cane and None Level of assistance: Modified independence Comments: Pt ambulated with SPC in RUE for support and was observed to have wide BOS with SPC and lateral trunk lean to left side. Pt ambulated with bilateral shortened step length, forward head posture and rounded shoulders, and decreased cadence. Pt ambulated with a step to gait pattern and decreased hip extension noted during toe off phase. Pt with significant right knee valgus noted during gait and during static stance.   Leg Length from umbilicus: symmetrical bilaterally Right leg length from ASIS: 103 cm (decreased right knee flexion and knee valgus present) Left leg length from ASIS: 100 cm  BERG: 43/50  FGA 10/30 (without use of an  assistive device) mCTSIB: significant sway with EO and EC on foam pad  03/16/2024 MMT Right eval Left eval  Hip flexion 4 4  Hip extension    Hip abduction 4- 4-  Hip adduction 4 4   5xSTS: 16.44 seconds with use of BUE support LEFs: 36 out of 80      LEFS:  43 out of 80 5xSTS: 22.95 sec.  (Took 2 practice attempts to complete proper sit to stand with pushing on arm rest).                                                                                                                             TREATMENT DATE: 06/12/2024    Subjective:   Pt entered PT with his SPC today. Pt feeling good today.  Pt reports 0/10 pain in the R knee and no falls since last session. Pt has been able to get around his house well. He said yesterday he made it a point to not use his cane around his house.   Pts. Wife states pt. Has been using ankle wts. At home.      Therapeutic Exercise:  Nustep L4 B UE/LE (seat position 12): 10 minutes with no rest breaks.  Discussed weekend activities.   5# ankle wts.: seated  marching 20x (fatigue)/ LAQ 20x (fatigue)/ 2 sets.   Reviewed HEP  Therapeutic Activity:   In // bars: Walking marching 4 laps. 6 hurdles walks 4 laps. Lateral walking L/R 4 laps. All exercise with 5# AW  with SBA(UE assist on handrails for safety).    Walking in hallway 255ft with SBA/CGA (no SPC today). Cuing for consistent heal strike and toe off   STS 5x in gym with no UE assist.     Walking from PT gym to car and SBA/CGA for safety with 2-point gait pattern.  Moderate cuing to increase R step length, increase hip flexion, and maintain straight line. Tends to drift to L side in parking lot.    PATIENT EDUCATION:  Education details: Anatomy involved, prognosis, POC, HEP, importance of continued participation in community activities such as walking and yoga Person educated: Patient Education method: Explanation, Demonstration, Tactile cues, Verbal cues, and Handouts Education comprehension: verbalized understanding and returned demonstration  HOME EXERCISE PROGRAM: Access Code: [email or web address] URL: [email or web address] Date: 02/21/2024 Prepared by: Ozell Sero Exercises - Standing Hip Extension with Counter Support - 1 x daily - 7 x weekly - 2 sets - 12 reps - Standing Hip Abduction with Counter Support - 1 x daily - 7 x weekly - 2 sets - 12 reps - Standing Hamstring Stretch with Step - 1 x daily - 7 x weekly - 3 sets - 10 reps - 20 sec. hold - Supine March - 1 x daily - 7 x weekly - 2 sets - 10 reps   ASSESSMENT:  CLINICAL IMPRESSION: Pt reports trying to walk around the house without cane yesterday. Pt reports that it went well  and that he had no stumbles or falls as a result. Pt often required cues to have upright posture and increase step length. Pt worked hard at exercises and required frequent rest breaks. Pt did well with 5# AW activities. Pt states he knee was starting to feel more pain today after exercise because he did not take his pain medication this morning. Pt  walked farther today with walking about 210 ft before needing a break. Reported that he was feeling some knee pain during the walk. Pt. Will continue to benefit from PT to progress LE strengthening to improve balance/ safety with with walking/ daily activity.    OBJECTIVE IMPAIRMENTS: Abnormal gait, decreased activity tolerance, decreased balance, decreased endurance, decreased knowledge of use of DME, difficulty walking, decreased ROM, decreased strength, postural dysfunction, and pain.   ACTIVITY LIMITATIONS: carrying, lifting, standing, and transfers  PARTICIPATION LIMITATIONS: cleaning, laundry, and community activity  PERSONAL FACTORS: Age and 1-2 comorbidities: hypertension and chronic low back pain are also affecting patient's functional outcome.   REHAB POTENTIAL: Good  CLINICAL DECISION MAKING: Evolving/moderate complexity  EVALUATION COMPLEXITY: High   GOALS: Goals reviewed with patient? Yes  LONG TERM GOALS: Target date: 07/17/2024   Pt to increase gross bilateral hip strength to at least a 4/5 in order to improve ability to stand for prolonged periods of time which is required for attending church services.  Baseline:see above. 10/23: 4/5 bilaterally and 4-/5 bilateral hip abduction.  12/10: see above.  1/12:  B hip flexion strength grossly 4/5 MMT Goal status: Partially met  2.   Pt to increase 5xSTS score to at least <20 seconds without the use of bilateral upper extremity support a to improve independence with functional transfers and decrease fall risk.  Baseline: 24.47 seconds with bilateral UE usage, (11/11): 16.44 seconds with BUE usage.  12/15: 22.95 sec.  (Took 2 practice attempts to complete proper sit to stand with pushing on arm rest).  No UE assist.  1/12: 25.75 sec. (No UE) Goal status: Partially met  3.  Pt to increase LEFs score to at least 49/80 to improve self-confidence of functional abilities with daily activities.  Baseline: 31/80, (11/11): 36/80.   12/15: 43 out of 80 Goal status: Partially met  4.   Pt will ambulate with consistent recip. Gait pattern with least assistive device for 10 minutes to improve community ambulation/ mod. I.   Baseline: see above (11/11): significant valgus of right knee with use of SPC in RUE  Goal status: Partially met  PLAN:  PT FREQUENCY: 2x/week  PT DURATION: 6 weeks  PLANNED INTERVENTIONS: 97164- PT Re-evaluation, 97110-Therapeutic exercises, 97530- Therapeutic activity, 97112- Neuromuscular re-education, 97535- Self Care, [id number]- Manual therapy, Patient/Family education, Balance training, Cryotherapy, and Moist heat  PLAN FOR NEXT SESSION: Continue gross LE strengthening for carryover for functional gait activities.     Ozell JAYSON Sero, PT, DPT # [id number] [place]ainer, SPT 06/12/2024, 5:20 PM  "

## 2024-06-14 ENCOUNTER — Ambulatory Visit: Admitting: Physical Therapy

## 2024-06-19 ENCOUNTER — Ambulatory Visit: Admitting: Physical Therapy

## 2024-06-21 ENCOUNTER — Ambulatory Visit: Admitting: Physical Therapy

## 2024-06-21 ENCOUNTER — Encounter: Payer: Self-pay | Admitting: Physical Therapy

## 2024-06-21 DIAGNOSIS — M1710 Unilateral primary osteoarthritis, unspecified knee: Secondary | ICD-10-CM

## 2024-06-21 DIAGNOSIS — M6281 Muscle weakness (generalized): Secondary | ICD-10-CM | POA: Diagnosis not present

## 2024-06-21 DIAGNOSIS — R2689 Other abnormalities of gait and mobility: Secondary | ICD-10-CM

## 2024-06-21 DIAGNOSIS — G8929 Other chronic pain: Secondary | ICD-10-CM

## 2024-06-21 DIAGNOSIS — M2170 Unequal limb length (acquired), unspecified site: Secondary | ICD-10-CM

## 2024-06-21 NOTE — Therapy (Signed)
 " OUTPATIENT PHYSICAL THERAPY LOWER EXTREMITY TREATMENT  Patient Name: Taylor Mcbride MRN: [id number] DOB:08/11/1945, 79 y.o., male Today's Date: 06/21/2024  END OF SESSION:  PT End of Session - 06/21/24 0903     Visit Number 26    Number of Visits 35    Date for Recertification  07/17/24    PT Start Time 0859    PT Stop Time 0945    PT Time Calculation (min) 46 min    Equipment Utilized During Treatment Gait belt    Activity Tolerance Patient tolerated treatment well;Patient limited by fatigue;Patient limited by pain    Behavior During Therapy [facility] for tasks assessed/performed          Past Medical History:  Diagnosis Date   Alcohol abuse, in remission    Hyperlipidemia    Hypertension    Spinal stenosis in cervical region    Past Surgical History:  Procedure Laterality Date   CARDIOVASCULAR STRESS TEST  2006   normal, during admission for chest pain,  [place]    COLONOSCOPY WITH PROPOFOL  N/A 05/08/2019   Procedure: COLONOSCOPY WITH PROPOFOL ;  Surgeon: Toledo, Ladell POUR, MD;  Location: [facility] ENDOSCOPY;  Service: Gastroenterology;  Laterality: N/A;   HERNIA REPAIR  1996   right inguinal hernia repair with mesh   SMALL INTESTINE SURGERY     cerivcal disk fusion   Patient Active Problem List   Diagnosis Date Noted   Hypertension 01/05/2012   Hyperlipidemia with target low density lipoprotein (LDL) cholesterol less than 100 mg/dL 91/86/7986   Arrhythmia 01/05/2012   Chronic back pain greater than 3 months duration 01/05/2012   Spinal stenosis in cervical region    Alcohol abuse, in remission    PCP: Delfina Pao, MD  REFERRING PROVIDER: Ewing, Lamar RIGGERS  REFERRING DIAG:  M17.11 (ICD-10-CM) - Unilateral primary osteoarthritis, right knee  M25.561 (ICD-10-CM) - Pain in right knee  G89.29 (ICD-10-CM) - Other chronic pain  R26.81 (ICD-10-CM) - Unsteadiness on feet   THERAPY DIAG:  Muscle weakness (generalized)  Chronic pain of right knee  Other abnormalities of  gait and mobility  Unilateral osteoarthritis of knee  Acquired leg length discrepancy  Rationale for Evaluation and Treatment: Rehabilitation  ONSET DATE: ~August 2025  SUBJECTIVE:   SUBJECTIVE STATEMENT: Pt is a 79 year old male who presents to physical therapy with concerns of chronic right knee pain and general lower extremity strength/endurance concerns affecting his functional independence. Pt has 0/10 right knee pain at rest but shooting pain can raise to a 10/10 with standing for long periods of time at church or walking for long periods of time in the community with his wife. Pt states that he feels fatigued with these activities and has noticed occasional instances of his right kne giving out. Pt reports that this is a chronic issue that has started irritating him more within the past two months. Pain is alleviated with rest and 1 week usage of right knee brace may be helping too, I'm not sure. Pt reports an allergy to NSAIDs so he is unable to take them for pain management at this time.   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 Pt reports having 4 metal rods placed in his right ankle which is why he has been wearing a brace over his right ankle for years. Pt has not used orthotic in right sneaker but does report that his left leg is longer than my right. Pt ambulating with SPC in clinic on  this day but states that this is new for him and that he has been walking at home without a device for the most part. Pt reports being independent at home and participates in yoga twice a week for general stretching/core strengthening.  PAIN:  Are you having pain? Yes: NPRS scale: 0/10 current, 10/10 at worst Pain location: General right knee pain Pain description: shooting Aggravating factors: Prolonged standing, prolonged walking, pivoting/turning at home, walking on outdoor surfaces Relieving factors: Rest  PRECAUTIONS: None  RED  FLAGS: None   WEIGHT BEARING RESTRICTIONS: No  FALLS:  Has patient fallen in last 6 months? Yes. Number of falls 1 (outdoors due to multitasking, assisted by family to get back up)  LIVING ENVIRONMENT: Lives with: lives with their spouse Lives in: House/apartment Stairs: No Has following equipment at home: Single point cane  OCCUPATION: Retired  PLOF: Independent  PATIENT GOALS: To decrease shooting pain in right knee and improve lower extremity strength so that he may return to outdoor walking with his wife and dog with greater ease  NEXT MD VISIT: None scheduled currently  OBJECTIVE:  Note: Objective measures were completed at Evaluation unless otherwise noted.  DIAGNOSTIC FINDINGS: N/A.  See care everywhere for [facility] records.  PATIENT SURVEYS:  LEFS: 31/80  COGNITION: Overall cognitive status: Within functional limits for tasks assessed     SENSATION: Light touch: WFL  MUSCLE LENGTH: Hamstrings: Right 75 deg; Left 65 deg   POSTURE: rounded shoulders, forward head, increased thoracic kyphosis, flexed trunk , and weight shift left  PALPATION: No significant tenderness or pain to touch  LOWER EXTREMITY ROM:  Active ROM Right eval Left eval  Hip flexion 94 deg 92 deg  Hip extension    Hip abduction    Hip adduction    Hip internal rotation 18 deg 14 deg  Hip external rotation 24 deg 29 deg  Knee flexion 121 deg 119 deg  Knee extension -5 deg -1 deg  Ankle dorsiflexion    Ankle plantarflexion    Ankle inversion    Ankle eversion     (Blank rows = not tested)  LOWER EXTREMITY MMT:  MMT Right eval Left eval  Hip flexion -4 -4  Hip extension    Hip abduction 4- 4-  Hip adduction 4- 4  Hip internal rotation 4- 4-  Hip external rotation 4- 4-  Knee flexion 4- 4-  Knee extension 4- 4+  Ankle dorsiflexion NT 4+  Ankle plantarflexion    Ankle inversion    Ankle eversion     (Blank rows = not tested)  LOWER EXTREMITY SPECIAL TESTS:  FADDIR:  Negative SLR: Negative   FUNCTIONAL TESTS:  5xSTS: 24.47 seconds (with bilateral UE support from chair arms)  GAIT: Distance walked: 40 ft around clinic Assistive device utilized: Single point cane and None Level of assistance: Modified independence Comments: Pt ambulated with SPC in RUE for support and was observed to have wide BOS with SPC and lateral trunk lean to left side. Pt ambulated with bilateral shortened step length, forward head posture and rounded shoulders, and decreased cadence. Pt ambulated with a step to gait pattern and decreased hip extension noted during toe off phase. Pt with significant right knee valgus noted during gait and during static stance.   Leg Length from umbilicus: symmetrical bilaterally Right leg length from ASIS: 103 cm (decreased right knee flexion and knee valgus present) Left leg length from ASIS: 100 cm  BERG: 43/50  FGA 10/30 (without use of an  assistive device) mCTSIB: significant sway with EO and EC on foam pad  03/16/2024 MMT Right eval Left eval  Hip flexion 4 4  Hip extension    Hip abduction 4- 4-  Hip adduction 4 4   5xSTS: 16.44 seconds with use of BUE support LEFs: 36 out of 80      LEFS:  43 out of 80 5xSTS: 22.95 sec.  (Took 2 practice attempts to complete proper sit to stand with pushing on arm rest).                                                                                                                             TREATMENT DATE: 06/21/2024    Subjective:  Pt entered with his SPC today. Pt reports feeling good. Pt reports 0/10 R knee pain. Pt reports that he has been walking around the house without a cane.    Therapeutic Exercise:  Nustep L4 B UE/LE (seat position 12): 10 minutes with no rest breaks.  Discussed weekend activities.   5# ankle wts.: seated marching 20x (fatigue)/ LAQ 20x (fatigue)/ 2 sets.   Discussed HEP  Therapeutic Activity:   In // bars: Walking marching 4 laps. Lateral walking L/R 4  laps. All exercise with 5# AW with SBA (UE assist on handrails for safety).    Walking in hallway 278ft with SBA/CGA. With 5# AW (With [facility] today). Cuing for consistent heal strike and toe off   STS 2x5 in gym with no UE assist.     Walking from PT gym to car and SBA/CGA for safety with 2-point gait pattern.  Moderate cuing to increase R step length, increase hip flexion, and maintain straight line. Tends to drift to L side in parking lot.    PATIENT EDUCATION:  Education details: Anatomy involved, prognosis, POC, HEP, importance of continued participation in community activities such as walking and yoga Person educated: Patient Education method: Explanation, Demonstration, Tactile cues, Verbal cues, and Handouts Education comprehension: verbalized understanding and returned demonstration  HOME EXERCISE PROGRAM: Access Code: [email or web address] URL: [email or web address] Date: 02/21/2024 Prepared by: Ozell Sero Exercises - Standing Hip Extension with Counter Support - 1 x daily - 7 x weekly - 2 sets - 12 reps - Standing Hip Abduction with Counter Support - 1 x daily - 7 x weekly - 2 sets - 12 reps - Standing Hamstring Stretch with Step - 1 x daily - 7 x weekly - 3 sets - 10 reps - 20 sec. hold - Supine March - 1 x daily - 7 x weekly - 2 sets - 10 reps   ASSESSMENT:  CLINICAL IMPRESSION: Pt did well with exercise today and step length during hallway walking.  Pt has shuffling gait and given cues to have consisted heel strike and to get feet off the ground. Pt worked hard at exercises and required frequent rest breaks especially after //-bar activities, and hallway walks. Pt did well with STS and was able to  complete those without UE assist. Pt. Will continue to benefit from PT to progress LE strengthening to improve balance/ safety with with walking/ daily activity.   OBJECTIVE IMPAIRMENTS: Abnormal gait, decreased activity tolerance, decreased balance, decreased endurance, decreased  knowledge of use of DME, difficulty walking, decreased ROM, decreased strength, postural dysfunction, and pain.   ACTIVITY LIMITATIONS: carrying, lifting, standing, and transfers  PARTICIPATION LIMITATIONS: cleaning, laundry, and community activity  PERSONAL FACTORS: Age and 1-2 comorbidities: hypertension and chronic low back pain are also affecting patient's functional outcome.   REHAB POTENTIAL: Good  CLINICAL DECISION MAKING: Evolving/moderate complexity  EVALUATION COMPLEXITY: High   GOALS: Goals reviewed with patient? Yes  LONG TERM GOALS: Target date: 07/17/2024   Pt to increase gross bilateral hip strength to at least a 4/5 in order to improve ability to stand for prolonged periods of time which is required for attending church services.  Baseline:see above. 10/23: 4/5 bilaterally and 4-/5 bilateral hip abduction.  12/10: see above.  1/12:  B hip flexion strength grossly 4/5 MMT Goal status: Partially met  2.   Pt to increase 5xSTS score to at least <20 seconds without the use of bilateral upper extremity support a to improve independence with functional transfers and decrease fall risk.  Baseline: 24.47 seconds with bilateral UE usage, (11/11): 16.44 seconds with BUE usage.  12/15: 22.95 sec.  (Took 2 practice attempts to complete proper sit to stand with pushing on arm rest).  No UE assist.  1/12: 25.75 sec. (No UE) Goal status: Partially met  3.  Pt to increase LEFs score to at least 49/80 to improve self-confidence of functional abilities with daily activities.  Baseline: 31/80, (11/11): 36/80.  12/15: 43 out of 80 Goal status: Partially met  4.   Pt will ambulate with consistent recip. Gait pattern with least assistive device for 10 minutes to improve community ambulation/ mod. I.   Baseline: see above (11/11): significant valgus of right knee with use of SPC in RUE  Goal status: Partially met  PLAN:  PT FREQUENCY: 2x/week  PT DURATION: 6 weeks  PLANNED  INTERVENTIONS: 97164- PT Re-evaluation, 97110-Therapeutic exercises, 97530- Therapeutic activity, 97112- Neuromuscular re-education, 97535- Self Care, [id number]- Manual therapy, Patient/Family education, Balance training, Cryotherapy, and Moist heat  PLAN FOR NEXT SESSION: Continue gross LE strengthening for carryover for functional gait activities.     Ozell JAYSON Sero, PT, DPT # [id number] [place] Gainer, SPT 06/21/2024, 10:29 AM  "

## 2024-06-26 ENCOUNTER — Ambulatory Visit: Admitting: Physical Therapy

## 2024-06-28 ENCOUNTER — Encounter: Payer: Self-pay | Admitting: Physical Therapy

## 2024-06-28 ENCOUNTER — Ambulatory Visit: Attending: Physician Assistant | Admitting: Physical Therapy

## 2024-06-28 DIAGNOSIS — M6281 Muscle weakness (generalized): Secondary | ICD-10-CM

## 2024-06-28 DIAGNOSIS — G8929 Other chronic pain: Secondary | ICD-10-CM

## 2024-06-28 DIAGNOSIS — R2689 Other abnormalities of gait and mobility: Secondary | ICD-10-CM

## 2024-06-28 DIAGNOSIS — M1710 Unilateral primary osteoarthritis, unspecified knee: Secondary | ICD-10-CM

## 2024-06-28 DIAGNOSIS — M2170 Unequal limb length (acquired), unspecified site: Secondary | ICD-10-CM

## 2024-06-28 NOTE — Therapy (Signed)
 " OUTPATIENT PHYSICAL THERAPY LOWER EXTREMITY TREATMENT  Patient Name: Marquan Vokes MRN: [id number] DOB:Apr 10, 1946, 79 y.o., male Today's Date: 06/28/2024  END OF SESSION:  PT End of Session - 06/28/24 0859     Visit Number 27    Number of Visits 35    Date for Recertification  07/17/24    PT Start Time 0859    PT Stop Time 0950    PT Time Calculation (min) 51 min    Equipment Utilized During Treatment Gait belt    Activity Tolerance Patient tolerated treatment well;Patient limited by fatigue;Patient limited by pain    Behavior During Therapy [facility] for tasks assessed/performed          Past Medical History:  Diagnosis Date   Alcohol abuse, in remission    Hyperlipidemia    Hypertension    Spinal stenosis in cervical region    Past Surgical History:  Procedure Laterality Date   CARDIOVASCULAR STRESS TEST  2006   normal, during admission for chest pain,  [place]    COLONOSCOPY WITH PROPOFOL  N/A 05/08/2019   Procedure: COLONOSCOPY WITH PROPOFOL ;  Surgeon: Toledo, Ladell POUR, MD;  Location: [facility] ENDOSCOPY;  Service: Gastroenterology;  Laterality: N/A;   HERNIA REPAIR  1996   right inguinal hernia repair with mesh   SMALL INTESTINE SURGERY     cerivcal disk fusion   Patient Active Problem List   Diagnosis Date Noted   Hypertension 01/05/2012   Hyperlipidemia with target low density lipoprotein (LDL) cholesterol less than 100 mg/dL 91/86/7986   Arrhythmia 01/05/2012   Chronic back pain greater than 3 months duration 01/05/2012   Spinal stenosis in cervical region    Alcohol abuse, in remission    PCP: Delfina Pao, MD  REFERRING PROVIDER: Ewing, Lamar RIGGERS  REFERRING DIAG:  M17.11 (ICD-10-CM) - Unilateral primary osteoarthritis, right knee  M25.561 (ICD-10-CM) - Pain in right knee  G89.29 (ICD-10-CM) - Other chronic pain  R26.81 (ICD-10-CM) - Unsteadiness on feet   THERAPY DIAG:  Muscle weakness (generalized)  Chronic pain of right knee  Other abnormalities of  gait and mobility  Unilateral osteoarthritis of knee  Acquired leg length discrepancy  Rationale for Evaluation and Treatment: Rehabilitation  ONSET DATE: ~August 2025  SUBJECTIVE:   SUBJECTIVE STATEMENT: Pt is a 79 year old male who presents to physical therapy with concerns of chronic right knee pain and general lower extremity strength/endurance concerns affecting his functional independence. Pt has 0/10 right knee pain at rest but shooting pain can raise to a 10/10 with standing for long periods of time at church or walking for long periods of time in the community with his wife. Pt states that he feels fatigued with these activities and has noticed occasional instances of his right kne giving out. Pt reports that this is a chronic issue that has started irritating him more within the past two months. Pain is alleviated with rest and 1 week usage of right knee brace may be helping too, I'm not sure. Pt reports an allergy to NSAIDs so he is unable to take them for pain management at this time.   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 Pt reports having 4 metal rods placed in his right ankle which is why he has been wearing a brace over his right ankle for years. Pt has not used orthotic in right sneaker but does report that his left leg is longer than my right. Pt ambulating with SPC in clinic on  this day but states that this is new for him and that he has been walking at home without a device for the most part. Pt reports being independent at home and participates in yoga twice a week for general stretching/core strengthening.  PAIN:  Are you having pain? Yes: NPRS scale: 0/10 current, 10/10 at worst Pain location: General right knee pain Pain description: shooting Aggravating factors: Prolonged standing, prolonged walking, pivoting/turning at home, walking on outdoor surfaces Relieving factors: Rest  PRECAUTIONS: None  RED  FLAGS: None   WEIGHT BEARING RESTRICTIONS: No  FALLS:  Has patient fallen in last 6 months? Yes. Number of falls 1 (outdoors due to multitasking, assisted by family to get back up)  LIVING ENVIRONMENT: Lives with: lives with their spouse Lives in: House/apartment Stairs: No Has following equipment at home: Single point cane  OCCUPATION: Retired  PLOF: Independent  PATIENT GOALS: To decrease shooting pain in right knee and improve lower extremity strength so that he may return to outdoor walking with his wife and dog with greater ease  NEXT MD VISIT: None scheduled currently  OBJECTIVE:  Note: Objective measures were completed at Evaluation unless otherwise noted.  DIAGNOSTIC FINDINGS: N/A.  See care everywhere for [facility] records.  PATIENT SURVEYS:  LEFS: 31/80  COGNITION: Overall cognitive status: Within functional limits for tasks assessed     SENSATION: Light touch: WFL  MUSCLE LENGTH: Hamstrings: Right 75 deg; Left 65 deg   POSTURE: rounded shoulders, forward head, increased thoracic kyphosis, flexed trunk , and weight shift left  PALPATION: No significant tenderness or pain to touch  LOWER EXTREMITY ROM:  Active ROM Right eval Left eval  Hip flexion 94 deg 92 deg  Hip extension    Hip abduction    Hip adduction    Hip internal rotation 18 deg 14 deg  Hip external rotation 24 deg 29 deg  Knee flexion 121 deg 119 deg  Knee extension -5 deg -1 deg  Ankle dorsiflexion    Ankle plantarflexion    Ankle inversion    Ankle eversion     (Blank rows = not tested)  LOWER EXTREMITY MMT:  MMT Right eval Left eval  Hip flexion -4 -4  Hip extension    Hip abduction 4- 4-  Hip adduction 4- 4  Hip internal rotation 4- 4-  Hip external rotation 4- 4-  Knee flexion 4- 4-  Knee extension 4- 4+  Ankle dorsiflexion NT 4+  Ankle plantarflexion    Ankle inversion    Ankle eversion     (Blank rows = not tested)  LOWER EXTREMITY SPECIAL TESTS:  FADDIR:  Negative SLR: Negative   FUNCTIONAL TESTS:  5xSTS: 24.47 seconds (with bilateral UE support from chair arms)  GAIT: Distance walked: 40 ft around clinic Assistive device utilized: Single point cane and None Level of assistance: Modified independence Comments: Pt ambulated with SPC in RUE for support and was observed to have wide BOS with SPC and lateral trunk lean to left side. Pt ambulated with bilateral shortened step length, forward head posture and rounded shoulders, and decreased cadence. Pt ambulated with a step to gait pattern and decreased hip extension noted during toe off phase. Pt with significant right knee valgus noted during gait and during static stance.   Leg Length from umbilicus: symmetrical bilaterally Right leg length from ASIS: 103 cm (decreased right knee flexion and knee valgus present) Left leg length from ASIS: 100 cm  BERG: 43/50  FGA 10/30 (without use of an  assistive device) mCTSIB: significant sway with EO and EC on foam pad  03/16/2024 MMT Right eval Left eval  Hip flexion 4 4  Hip extension    Hip abduction 4- 4-  Hip adduction 4 4   5xSTS: 16.44 seconds with use of BUE support LEFs: 36 out of 80      LEFS:  43 out of 80 5xSTS: 22.95 sec.  (Took 2 practice attempts to complete proper sit to stand with pushing on arm rest).                                                                                                                             TREATMENT DATE: 06/28/2024    Subjective:  Pt entered with his SPC today and states he is tired.  Pt reports 0/10 R knee pain prior to tx. session. Pt reports that he has been feeling sore in his back and legs. Pt reports that he walks around the home most of the time without his SPC.  Pt. Using ankle wts. At home with exercise program.     Therapeutic Exercise:  Nustep L4 B UE/LE (seat position 12): 10 minutes with no rest breaks.  Discussed weekend activities.   5# ankle wts.: seated marching 20x  (fatigue)/ LAQ 20x  1 set  Discussed HEP  Therapeutic Activity:   In // bars: Walking marching 5 laps. Lateral walking L/R 5 laps. All exercise with 5# AW with SBA (UE assist on handrails for safety).    Walking in hallway 179ft with SBA/CGA. With 5# AW. Followed by no resistance walking for 267ft. Cuing for consistent heal strike and toe off and upright posture.  STS 2x5 in gym with no UE assist.    Stair taps with UE and CGA assist 1x20   Walking from PT gym to car and SBA/CGA for safety with 2-point gait pattern.  Moderate cuing to increase R step length, increase hip flexion, and maintain straight line.   PATIENT EDUCATION:  Education details: Anatomy involved, prognosis, POC, HEP, importance of continued participation in community activities such as walking and yoga Person educated: Patient Education method: Explanation, Demonstration, Tactile cues, Verbal cues, and Handouts Education comprehension: verbalized understanding and returned demonstration  HOME EXERCISE PROGRAM: Access Code: [email or web address] URL: [email or web address] Date: 02/21/2024 Prepared by: Ozell Sero Exercises - Standing Hip Extension with Counter Support - 1 x daily - 7 x weekly - 2 sets - 12 reps - Standing Hip Abduction with Counter Support - 1 x daily - 7 x weekly - 2 sets - 12 reps - Standing Hamstring Stretch with Step - 1 x daily - 7 x weekly - 3 sets - 10 reps - 20 sec. hold - Supine March - 1 x daily - 7 x weekly - 2 sets - 10 reps   ASSESSMENT:  CLINICAL IMPRESSION: Pt did well with exercise today despite presenting with generalized fatigue. Pt was able to walk more laps with lateral and high  marches during gait.  Pt has shuffling gait and given cues to have more consistent heel strike and to get feet off the ground. Pt worked hard at exercises and required frequent rest breaks especially after //-bar activities, and hallway walks. Pt. Will continue to benefit from PT to progress LE  strengthening to improve balance/ safety with walking/ daily activity.    OBJECTIVE IMPAIRMENTS: Abnormal gait, decreased activity tolerance, decreased balance, decreased endurance, decreased knowledge of use of DME, difficulty walking, decreased ROM, decreased strength, postural dysfunction, and pain.   ACTIVITY LIMITATIONS: carrying, lifting, standing, and transfers  PARTICIPATION LIMITATIONS: cleaning, laundry, and community activity  PERSONAL FACTORS: Age and 1-2 comorbidities: hypertension and chronic low back pain are also affecting patient's functional outcome.   REHAB POTENTIAL: Good  CLINICAL DECISION MAKING: Evolving/moderate complexity  EVALUATION COMPLEXITY: High   GOALS: Goals reviewed with patient? Yes  LONG TERM GOALS: Target date: 07/17/2024   Pt to increase gross bilateral hip strength to at least a 4/5 in order to improve ability to stand for prolonged periods of time which is required for attending church services.  Baseline:see above. 10/23: 4/5 bilaterally and 4-/5 bilateral hip abduction.  12/10: see above.  1/12:  B hip flexion strength grossly 4/5 MMT Goal status: Partially met  2.   Pt to increase 5xSTS score to at least <20 seconds without the use of bilateral upper extremity support a to improve independence with functional transfers and decrease fall risk.  Baseline: 24.47 seconds with bilateral UE usage, (11/11): 16.44 seconds with BUE usage.  12/15: 22.95 sec.  (Took 2 practice attempts to complete proper sit to stand with pushing on arm rest).  No UE assist.  1/12: 25.75 sec. (No UE) Goal status: Partially met  3.  Pt to increase LEFs score to at least 49/80 to improve self-confidence of functional abilities with daily activities.  Baseline: 31/80, (11/11): 36/80.  12/15: 43 out of 80 Goal status: Partially met  4.   Pt will ambulate with consistent recip. Gait pattern with least assistive device for 10 minutes to improve community ambulation/ mod. I.    Baseline: see above (11/11): significant valgus of right knee with use of SPC in RUE  Goal status: Partially met  PLAN:  PT FREQUENCY: 2x/week  PT DURATION: 6 weeks  PLANNED INTERVENTIONS: 97164- PT Re-evaluation, 97110-Therapeutic exercises, 97530- Therapeutic activity, 97112- Neuromuscular re-education, 97535- Self Care, [id number]- Manual therapy, Patient/Family education, Balance training, Cryotherapy, and Moist heat  PLAN FOR NEXT SESSION: Continue gross LE strengthening for carryover for functional gait activities.     Ozell JAYSON Sero, PT, DPT # [id number] [place], SPT 06/28/2024, 3:50 PM  "

## 2024-07-03 ENCOUNTER — Ambulatory Visit: Admitting: Physical Therapy

## 2024-07-05 ENCOUNTER — Ambulatory Visit: Admitting: Physical Therapy

## 2024-07-10 ENCOUNTER — Ambulatory Visit: Admitting: Physical Therapy

## 2024-07-12 ENCOUNTER — Ambulatory Visit: Admitting: Physical Therapy

## 2024-07-17 ENCOUNTER — Ambulatory Visit: Admitting: Physical Therapy

## 2024-07-19 ENCOUNTER — Ambulatory Visit: Admitting: Physical Therapy

## 2024-07-24 ENCOUNTER — Ambulatory Visit: Admitting: Physical Therapy

## 2024-07-26 ENCOUNTER — Ambulatory Visit: Admitting: Physical Therapy

## 2024-07-31 ENCOUNTER — Ambulatory Visit: Admitting: Physical Therapy

## 2024-08-02 ENCOUNTER — Ambulatory Visit: Admitting: Physical Therapy
